# Patient Record
Sex: Female | Born: 1980 | Race: Black or African American | Hispanic: No | Marital: Single | Smoking: Former smoker
Health system: Southern US, Community
[De-identification: ages and names within clinical notes are randomized; demographics above are authoritative.]

## PROBLEM LIST (undated history)

## (undated) ENCOUNTER — Ambulatory Visit (HOSPITAL_COMMUNITY): Admission: EM | Payer: MEDICAID

## (undated) ENCOUNTER — Inpatient Hospital Stay (HOSPITAL_COMMUNITY): Payer: Self-pay

## (undated) DIAGNOSIS — B009 Herpesviral infection, unspecified: Secondary | ICD-10-CM

## (undated) HISTORY — PX: WISDOM TOOTH EXTRACTION: SHX21

## (undated) HISTORY — PX: BREAST BIOPSY: SHX20

---
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
## 2002-08-09 ENCOUNTER — Encounter: Payer: Self-pay | Admitting: Obstetrics and Gynecology

## 2002-08-09 ENCOUNTER — Inpatient Hospital Stay (HOSPITAL_COMMUNITY): Admission: AD | Admit: 2002-08-09 | Discharge: 2002-08-09 | Payer: Self-pay | Admitting: Obstetrics and Gynecology

## 2002-09-28 ENCOUNTER — Inpatient Hospital Stay (HOSPITAL_COMMUNITY): Admission: AD | Admit: 2002-09-28 | Discharge: 2002-09-28 | Payer: Self-pay | Admitting: *Deleted

## 2002-09-30 ENCOUNTER — Encounter: Admission: RE | Admit: 2002-09-30 | Discharge: 2002-09-30 | Payer: Self-pay | Admitting: Obstetrics and Gynecology

## 2002-10-01 ENCOUNTER — Ambulatory Visit (HOSPITAL_COMMUNITY): Admission: RE | Admit: 2002-10-01 | Discharge: 2002-10-01 | Payer: Self-pay

## 2002-10-22 ENCOUNTER — Ambulatory Visit (HOSPITAL_COMMUNITY): Admission: RE | Admit: 2002-10-22 | Discharge: 2002-10-22 | Payer: Self-pay | Admitting: *Deleted

## 2002-11-02 ENCOUNTER — Ambulatory Visit (HOSPITAL_COMMUNITY): Admission: RE | Admit: 2002-11-02 | Discharge: 2002-11-02 | Payer: Self-pay | Admitting: *Deleted

## 2002-11-12 ENCOUNTER — Ambulatory Visit (HOSPITAL_COMMUNITY): Admission: RE | Admit: 2002-11-12 | Discharge: 2002-11-12 | Payer: Self-pay | Admitting: *Deleted

## 2002-11-23 ENCOUNTER — Inpatient Hospital Stay (HOSPITAL_COMMUNITY): Admission: AD | Admit: 2002-11-23 | Discharge: 2002-11-23 | Payer: Self-pay | Admitting: *Deleted

## 2002-11-29 ENCOUNTER — Inpatient Hospital Stay (HOSPITAL_COMMUNITY): Admission: AD | Admit: 2002-11-29 | Discharge: 2002-11-29 | Payer: Self-pay | Admitting: Obstetrics & Gynecology

## 2002-12-10 ENCOUNTER — Inpatient Hospital Stay (HOSPITAL_COMMUNITY): Admission: AD | Admit: 2002-12-10 | Discharge: 2002-12-10 | Payer: Self-pay | Admitting: Family Medicine

## 2002-12-28 ENCOUNTER — Observation Stay (HOSPITAL_COMMUNITY): Admission: AD | Admit: 2002-12-28 | Discharge: 2002-12-28 | Payer: Self-pay | Admitting: *Deleted

## 2003-01-06 ENCOUNTER — Inpatient Hospital Stay (HOSPITAL_COMMUNITY): Admission: AD | Admit: 2003-01-06 | Discharge: 2003-01-06 | Payer: Self-pay | Admitting: Family Medicine

## 2003-01-13 ENCOUNTER — Inpatient Hospital Stay (HOSPITAL_COMMUNITY): Admission: AD | Admit: 2003-01-13 | Discharge: 2003-01-15 | Payer: Self-pay | Admitting: Obstetrics and Gynecology

## 2003-02-13 ENCOUNTER — Emergency Department (HOSPITAL_COMMUNITY): Admission: EM | Admit: 2003-02-13 | Discharge: 2003-02-13 | Payer: Self-pay | Admitting: Emergency Medicine

## 2005-09-19 ENCOUNTER — Emergency Department (HOSPITAL_COMMUNITY): Admission: EM | Admit: 2005-09-19 | Discharge: 2005-09-19 | Payer: Self-pay | Admitting: Emergency Medicine

## 2005-10-18 ENCOUNTER — Emergency Department (HOSPITAL_COMMUNITY): Admission: EM | Admit: 2005-10-18 | Discharge: 2005-10-18 | Payer: Self-pay | Admitting: Emergency Medicine

## 2006-06-30 ENCOUNTER — Emergency Department (HOSPITAL_COMMUNITY): Admission: EM | Admit: 2006-06-30 | Discharge: 2006-06-30 | Payer: Self-pay | Admitting: *Deleted

## 2006-11-01 ENCOUNTER — Emergency Department (HOSPITAL_COMMUNITY): Admission: EM | Admit: 2006-11-01 | Discharge: 2006-11-01 | Payer: Self-pay | Admitting: Emergency Medicine

## 2007-01-25 ENCOUNTER — Emergency Department (HOSPITAL_COMMUNITY): Admission: EM | Admit: 2007-01-25 | Discharge: 2007-01-25 | Payer: Self-pay | Admitting: Emergency Medicine

## 2007-06-14 ENCOUNTER — Ambulatory Visit: Payer: Self-pay | Admitting: Family Medicine

## 2007-06-14 ENCOUNTER — Inpatient Hospital Stay (HOSPITAL_COMMUNITY): Admission: AD | Admit: 2007-06-14 | Discharge: 2007-06-14 | Payer: Self-pay | Admitting: Family Medicine

## 2007-07-11 ENCOUNTER — Emergency Department (HOSPITAL_COMMUNITY): Admission: EM | Admit: 2007-07-11 | Discharge: 2007-07-11 | Payer: Self-pay | Admitting: Emergency Medicine

## 2007-08-29 ENCOUNTER — Emergency Department (HOSPITAL_COMMUNITY): Admission: EM | Admit: 2007-08-29 | Discharge: 2007-08-29 | Payer: Self-pay | Admitting: Emergency Medicine

## 2007-09-08 ENCOUNTER — Emergency Department (HOSPITAL_COMMUNITY): Admission: EM | Admit: 2007-09-08 | Discharge: 2007-09-08 | Payer: Self-pay | Admitting: Emergency Medicine

## 2008-01-05 ENCOUNTER — Emergency Department (HOSPITAL_COMMUNITY): Admission: EM | Admit: 2008-01-05 | Discharge: 2008-01-05 | Payer: Self-pay | Admitting: Emergency Medicine

## 2008-07-05 ENCOUNTER — Inpatient Hospital Stay (HOSPITAL_COMMUNITY): Admission: AD | Admit: 2008-07-05 | Discharge: 2008-07-05 | Payer: Self-pay | Admitting: Obstetrics & Gynecology

## 2008-07-05 ENCOUNTER — Ambulatory Visit: Payer: Self-pay | Admitting: Physician Assistant

## 2009-04-08 ENCOUNTER — Inpatient Hospital Stay (HOSPITAL_COMMUNITY): Admission: AD | Admit: 2009-04-08 | Discharge: 2009-04-08 | Payer: Self-pay | Admitting: Obstetrics & Gynecology

## 2009-04-08 ENCOUNTER — Ambulatory Visit: Payer: Self-pay | Admitting: Obstetrics and Gynecology

## 2009-09-01 ENCOUNTER — Ambulatory Visit: Payer: Self-pay | Admitting: Nurse Practitioner

## 2009-09-01 ENCOUNTER — Inpatient Hospital Stay (HOSPITAL_COMMUNITY): Admission: AD | Admit: 2009-09-01 | Discharge: 2009-09-01 | Payer: Self-pay | Admitting: Family Medicine

## 2010-04-06 LAB — WET PREP, GENITAL: Trich, Wet Prep: NONE SEEN

## 2010-04-06 LAB — POCT PREGNANCY, URINE: Preg Test, Ur: NEGATIVE

## 2010-04-06 LAB — HERPES SIMPLEX VIRUS CULTURE: Culture: DETECTED

## 2010-04-06 LAB — URINALYSIS, ROUTINE W REFLEX MICROSCOPIC
Glucose, UA: NEGATIVE mg/dL
Ketones, ur: NEGATIVE mg/dL
Protein, ur: NEGATIVE mg/dL
pH: 5.5 (ref 5.0–8.0)

## 2010-04-06 LAB — URINE MICROSCOPIC-ADD ON

## 2010-04-15 LAB — URINALYSIS, ROUTINE W REFLEX MICROSCOPIC
Nitrite: NEGATIVE
Specific Gravity, Urine: <1.005 — ABNORMAL LOW (ref 1.005–1.030)
pH: 5.5 (ref 5.0–8.0)

## 2010-04-15 LAB — CBC
Hemoglobin: 14 g/dL (ref 12.0–15.0)
MCHC: 33.1 g/dL (ref 30.0–36.0)
MCV: 89.5 fL (ref 78.0–100.0)
RBC: 4.72 MIL/uL (ref 3.87–5.11)
WBC: 9.1 10*3/uL (ref 4.0–10.5)

## 2010-04-15 LAB — WET PREP, GENITAL: Yeast Wet Prep HPF POC: NONE SEEN

## 2010-04-15 LAB — URINE MICROSCOPIC-ADD ON

## 2010-04-15 LAB — GC/CHLAMYDIA PROBE AMP, GENITAL: Chlamydia, DNA Probe: NEGATIVE

## 2010-04-30 LAB — DIFFERENTIAL
Basophils Relative: 1 % (ref 0–1)
Eosinophils Absolute: 0 10*3/uL (ref 0.0–0.7)
Monocytes Relative: 5 % (ref 3–12)
Neutrophils Relative %: 82 % — ABNORMAL HIGH (ref 43–77)

## 2010-04-30 LAB — CBC
HCT: 42.1 % (ref 36.0–46.0)
MCHC: 34.6 g/dL (ref 30.0–36.0)
Platelets: 198 10*3/uL (ref 150–400)
RBC: 4.76 MIL/uL (ref 3.87–5.11)
RDW: 13.1 % (ref 11.5–15.5)
WBC: 15.8 10*3/uL — ABNORMAL HIGH (ref 4.0–10.5)

## 2010-04-30 LAB — URINALYSIS, ROUTINE W REFLEX MICROSCOPIC
Bilirubin Urine: NEGATIVE
Ketones, ur: NEGATIVE mg/dL
Protein, ur: 30 mg/dL — AB
Specific Gravity, Urine: 1.02 (ref 1.005–1.030)
pH: 7.5 (ref 5.0–8.0)

## 2010-04-30 LAB — POCT PREGNANCY, URINE: Preg Test, Ur: NEGATIVE

## 2010-04-30 LAB — COMPREHENSIVE METABOLIC PANEL
ALT: 23 U/L (ref 0–35)
Alkaline Phosphatase: 60 U/L (ref 39–117)
CO2: 21 mEq/L (ref 19–32)
Chloride: 106 mEq/L (ref 96–112)
Glucose, Bld: 125 mg/dL — ABNORMAL HIGH (ref 70–99)
Potassium: 3.3 mEq/L — ABNORMAL LOW (ref 3.5–5.1)
Sodium: 139 mEq/L (ref 135–145)
Total Protein: 7.7 g/dL (ref 6.0–8.3)

## 2010-04-30 LAB — GAMMA GT: GGT: 18 U/L (ref 7–51)

## 2010-04-30 LAB — RAPID URINE DRUG SCREEN, HOSP PERFORMED
Cocaine: NOT DETECTED
Opiates: NOT DETECTED
Tetrahydrocannabinol: POSITIVE — AB

## 2010-04-30 LAB — URINE MICROSCOPIC-ADD ON

## 2010-07-24 ENCOUNTER — Emergency Department (HOSPITAL_COMMUNITY)
Admission: EM | Admit: 2010-07-24 | Discharge: 2010-07-24 | Discharge status: Against Medical Advice | Payer: Self-pay | Attending: Emergency Medicine | Admitting: Emergency Medicine

## 2010-07-24 DIAGNOSIS — Z0389 Encounter for observation for other suspected diseases and conditions ruled out: Secondary | ICD-10-CM | POA: Insufficient documentation

## 2010-08-02 ENCOUNTER — Emergency Department (HOSPITAL_COMMUNITY)
Admission: EM | Admit: 2010-08-02 | Discharge: 2010-08-02 | Disposition: A | Discharge status: Home / Self Care | Payer: Self-pay | Attending: Emergency Medicine | Admitting: Emergency Medicine

## 2010-08-02 DIAGNOSIS — R112 Nausea with vomiting, unspecified: Secondary | ICD-10-CM | POA: Insufficient documentation

## 2010-08-02 DIAGNOSIS — K5289 Other specified noninfective gastroenteritis and colitis: Secondary | ICD-10-CM | POA: Insufficient documentation

## 2010-08-02 DIAGNOSIS — R197 Diarrhea, unspecified: Secondary | ICD-10-CM | POA: Insufficient documentation

## 2010-08-02 LAB — CBC
HCT: 42.7 % (ref 36.0–46.0)
MCH: 30.1 pg (ref 26.0–34.0)
MCHC: 34.4 g/dL (ref 30.0–36.0)
MCV: 87.3 fL (ref 78.0–100.0)
RDW: 13.7 % (ref 11.5–15.5)

## 2010-08-02 LAB — COMPREHENSIVE METABOLIC PANEL
ALT: 17 U/L (ref 0–35)
AST: 18 U/L (ref 0–37)
Alkaline Phosphatase: 47 U/L (ref 39–117)
CO2: 19 mEq/L (ref 19–32)
GFR calc Af Amer: >60 mL/min (ref >60)
GFR calc non Af Amer: >60 mL/min (ref >60)
Glucose, Bld: 125 mg/dL — ABNORMAL HIGH (ref 70–99)
Potassium: 3.8 mEq/L (ref 3.5–5.1)
Sodium: 139 mEq/L (ref 135–145)

## 2010-08-02 LAB — URINALYSIS, ROUTINE W REFLEX MICROSCOPIC
Bilirubin Urine: NEGATIVE
Glucose, UA: NEGATIVE mg/dL
Hgb urine dipstick: NEGATIVE
Ketones, ur: 15 mg/dL — AB
Protein, ur: 30 mg/dL — AB

## 2010-08-02 LAB — DIFFERENTIAL
Basophils Absolute: 0.1 10*3/uL (ref 0.0–0.1)
Eosinophils Relative: 0 % (ref 0–5)
Lymphocytes Relative: 27 % (ref 12–46)
Lymphs Abs: 2.8 10*3/uL (ref 0.7–4.0)
Monocytes Absolute: 0.7 10*3/uL (ref 0.1–1.0)

## 2010-08-02 LAB — WET PREP, GENITAL: Trich, Wet Prep: NONE SEEN

## 2010-08-02 LAB — OCCULT BLOOD, POC DEVICE: Fecal Occult Bld: POSITIVE

## 2010-08-03 LAB — URINE CULTURE

## 2010-08-03 LAB — GC/CHLAMYDIA PROBE AMP, GENITAL: GC Probe Amp, Genital: NEGATIVE

## 2010-10-11 LAB — URINALYSIS, ROUTINE W REFLEX MICROSCOPIC
Bilirubin Urine: NEGATIVE
Leukocytes, UA: NEGATIVE
Nitrite: NEGATIVE
Specific Gravity, Urine: 1.012
pH: 6

## 2010-10-11 LAB — URINE MICROSCOPIC-ADD ON

## 2010-10-11 LAB — POCT PREGNANCY, URINE: Preg Test, Ur: NEGATIVE

## 2010-10-17 LAB — COMPREHENSIVE METABOLIC PANEL
ALT: 27
AST: 25
Albumin: 3.8
Calcium: 9.6
Creatinine, Ser: 0.9
GFR calc Af Amer: >60
Sodium: 140
Total Protein: 6.9

## 2010-10-17 LAB — CBC
MCHC: 34.3
MCV: 87.5
Platelets: 199
RBC: 4.58
RDW: 13.7

## 2010-10-17 LAB — RAPID URINE DRUG SCREEN, HOSP PERFORMED
Barbiturates: NOT DETECTED
Benzodiazepines: NOT DETECTED
Cocaine: NOT DETECTED

## 2010-10-17 LAB — WET PREP, GENITAL: Trich, Wet Prep: NONE SEEN

## 2010-10-17 LAB — GC/CHLAMYDIA PROBE AMP, GENITAL
Chlamydia, DNA Probe: NEGATIVE
GC Probe Amp, Genital: NEGATIVE

## 2010-10-17 LAB — DIFFERENTIAL
Eosinophils Absolute: 0
Eosinophils Relative: 0
Lymphocytes Relative: 9 — ABNORMAL LOW
Lymphs Abs: 1.4
Monocytes Relative: 3

## 2010-10-17 LAB — URINALYSIS, ROUTINE W REFLEX MICROSCOPIC
Glucose, UA: NEGATIVE
Ketones, ur: NEGATIVE
Protein, ur: 30 — AB
pH: 7.5

## 2010-10-17 LAB — URINE MICROSCOPIC-ADD ON

## 2010-10-17 LAB — SAMPLE TO BLOOD BANK

## 2010-10-18 LAB — CBC
Hemoglobin: 15.2 — ABNORMAL HIGH
MCHC: 35
MCV: 87.6
RBC: 4.96
RDW: 13.6

## 2010-10-18 LAB — COMPREHENSIVE METABOLIC PANEL
ALT: 27
CO2: 20
Calcium: 9.7
Creatinine, Ser: 0.84
Glucose, Bld: 131 — ABNORMAL HIGH
Sodium: 139
Total Bilirubin: 1.7 — ABNORMAL HIGH
Total Protein: 7.4

## 2010-10-18 LAB — DIFFERENTIAL
Basophils Absolute: 0.1
Basophils Relative: 0
Eosinophils Absolute: 0
Eosinophils Relative: 0
Monocytes Absolute: 0.5

## 2010-10-18 LAB — URINALYSIS, ROUTINE W REFLEX MICROSCOPIC
Bilirubin Urine: NEGATIVE
Hgb urine dipstick: NEGATIVE
Protein, ur: NEGATIVE
Urobilinogen, UA: 0.2

## 2010-10-18 LAB — POCT PREGNANCY, URINE: Preg Test, Ur: NEGATIVE

## 2010-10-18 LAB — WET PREP, GENITAL: Yeast Wet Prep HPF POC: NONE SEEN

## 2010-10-19 LAB — DIFFERENTIAL
Basophils Relative: 0
Eosinophils Absolute: 0
Eosinophils Absolute: 0
Eosinophils Relative: 0
Eosinophils Relative: 0
Lymphs Abs: 0.7
Lymphs Abs: 1.5
Monocytes Absolute: 0.3
Monocytes Relative: 3
Neutrophils Relative %: 90 — ABNORMAL HIGH

## 2010-10-19 LAB — COMPREHENSIVE METABOLIC PANEL
AST: 28
BUN: 8
CO2: 22
Calcium: 10.7 — ABNORMAL HIGH
Creatinine, Ser: 1
GFR calc Af Amer: >60
GFR calc non Af Amer: >60
Glucose, Bld: 131 — ABNORMAL HIGH

## 2010-10-19 LAB — URINALYSIS, ROUTINE W REFLEX MICROSCOPIC
Nitrite: NEGATIVE
Specific Gravity, Urine: 1.023
Urobilinogen, UA: 0.2
pH: 8.5 — ABNORMAL HIGH

## 2010-10-19 LAB — CBC
HCT: 38.8
HCT: 48.9 — ABNORMAL HIGH
Hemoglobin: 15.7 — ABNORMAL HIGH
MCHC: 33.4
MCV: 90.2
MCV: 91.8
Platelets: 162
Platelets: 199
WBC: 14.1 — ABNORMAL HIGH

## 2010-10-19 LAB — URINE MICROSCOPIC-ADD ON

## 2010-10-19 LAB — POCT I-STAT, CHEM 8
Chloride: 108
HCT: 51 — ABNORMAL HIGH
Hemoglobin: 17.3 — ABNORMAL HIGH
Potassium: 4.4
Sodium: 141

## 2010-10-19 LAB — LIPASE, BLOOD: Lipase: 27

## 2010-10-26 LAB — POCT I-STAT, CHEM 8
Chloride: 112 mEq/L (ref 96–112)
HCT: 45 % (ref 36.0–46.0)
Hemoglobin: 15.3 g/dL — ABNORMAL HIGH (ref 12.0–15.0)
Potassium: 3.8 mEq/L (ref 3.5–5.1)
Sodium: 144 mEq/L (ref 135–145)

## 2010-10-26 LAB — DIFFERENTIAL
Basophils Absolute: 0.1 K/uL (ref 0.0–0.1)
Basophils Relative: 1 % (ref 0–1)
Eosinophils Absolute: 0 K/uL (ref 0.0–0.7)
Eosinophils Relative: 0 % (ref 0–5)
Lymphocytes Relative: 15 % (ref 12–46)
Lymphs Abs: 2.4 K/uL (ref 0.7–4.0)
Monocytes Absolute: 0.6 K/uL (ref 0.1–1.0)
Monocytes Relative: 4 % (ref 3–12)
Neutro Abs: 12.8 K/uL — ABNORMAL HIGH (ref 1.7–7.7)
Neutrophils Relative %: 81 % — ABNORMAL HIGH (ref 43–77)

## 2010-10-26 LAB — URINALYSIS, ROUTINE W REFLEX MICROSCOPIC
Bilirubin Urine: NEGATIVE
Hgb urine dipstick: NEGATIVE
Ketones, ur: 15 mg/dL — AB
Specific Gravity, Urine: 1.032 — ABNORMAL HIGH (ref 1.005–1.030)
Urobilinogen, UA: 0.2 mg/dL (ref 0.0–1.0)

## 2010-10-26 LAB — CBC
MCHC: 33.5 g/dL (ref 30.0–36.0)
MCV: 88.3 fL (ref 78.0–100.0)
Platelets: 238 10*3/uL (ref 150–400)
RBC: 4.83 MIL/uL (ref 3.87–5.11)
WBC: 16 10*3/uL — ABNORMAL HIGH (ref 4.0–10.5)

## 2010-10-26 LAB — POCT PREGNANCY, URINE: Preg Test, Ur: NEGATIVE

## 2010-10-26 LAB — HEPATIC FUNCTION PANEL
ALT: 28 U/L (ref 0–35)
AST: 32 U/L (ref 0–37)
Albumin: 4.2 g/dL (ref 3.5–5.2)
Alkaline Phosphatase: 52 U/L (ref 39–117)
Bilirubin, Direct: 0.1 mg/dL (ref 0.0–0.3)
Indirect Bilirubin: 0.5 mg/dL (ref 0.3–0.9)
Total Bilirubin: 0.6 mg/dL (ref 0.3–1.2)
Total Protein: 7.1 g/dL (ref 6.0–8.3)

## 2010-10-26 LAB — RAPID URINE DRUG SCREEN, HOSP PERFORMED
Amphetamines: NOT DETECTED
Barbiturates: NOT DETECTED
Benzodiazepines: NOT DETECTED
Cocaine: NOT DETECTED
Opiates: NOT DETECTED
Tetrahydrocannabinol: POSITIVE — AB

## 2010-10-26 LAB — URINE MICROSCOPIC-ADD ON

## 2010-11-01 LAB — URINE MICROSCOPIC-ADD ON

## 2010-11-01 LAB — POCT PREGNANCY, URINE: Operator id: 108131

## 2010-11-01 LAB — URINALYSIS, ROUTINE W REFLEX MICROSCOPIC
Bilirubin Urine: NEGATIVE
Hgb urine dipstick: NEGATIVE
Protein, ur: 30 — AB
Urobilinogen, UA: 0.2

## 2010-11-01 LAB — COMPREHENSIVE METABOLIC PANEL
AST: 45 — ABNORMAL HIGH
Albumin: 4.3
Alkaline Phosphatase: 49
Chloride: 106
GFR calc Af Amer: >60
Potassium: 3.5
Total Bilirubin: 0.9

## 2010-11-01 LAB — DIFFERENTIAL
Basophils Absolute: 0
Basophils Relative: 0
Eosinophils Relative: 0
Monocytes Absolute: 0.4

## 2010-11-01 LAB — WET PREP, GENITAL
Clue Cells Wet Prep HPF POC: NONE SEEN
WBC, Wet Prep HPF POC: NONE SEEN
Yeast Wet Prep HPF POC: NONE SEEN

## 2010-11-01 LAB — CBC
HCT: 40.1
Platelets: 210
WBC: 13.5 — ABNORMAL HIGH

## 2010-11-08 LAB — D-DIMER, QUANTITATIVE: D-Dimer, Quant: <0.22

## 2010-11-22 ENCOUNTER — Emergency Department (HOSPITAL_COMMUNITY)
Admission: EM | Admit: 2010-11-22 | Discharge: 2010-11-22 | Disposition: A | Discharge status: Home / Self Care | Payer: Self-pay | Attending: Emergency Medicine | Admitting: Emergency Medicine

## 2010-11-22 ENCOUNTER — Emergency Department (HOSPITAL_COMMUNITY): Payer: Self-pay

## 2010-11-22 DIAGNOSIS — N83209 Unspecified ovarian cyst, unspecified side: Secondary | ICD-10-CM | POA: Insufficient documentation

## 2010-11-22 DIAGNOSIS — R112 Nausea with vomiting, unspecified: Secondary | ICD-10-CM | POA: Insufficient documentation

## 2010-11-22 DIAGNOSIS — N390 Urinary tract infection, site not specified: Secondary | ICD-10-CM | POA: Insufficient documentation

## 2010-11-22 DIAGNOSIS — R109 Unspecified abdominal pain: Secondary | ICD-10-CM | POA: Insufficient documentation

## 2010-11-22 DIAGNOSIS — R300 Dysuria: Secondary | ICD-10-CM | POA: Insufficient documentation

## 2010-11-22 DIAGNOSIS — R197 Diarrhea, unspecified: Secondary | ICD-10-CM | POA: Insufficient documentation

## 2010-11-22 LAB — COMPREHENSIVE METABOLIC PANEL
Alkaline Phosphatase: 50 U/L (ref 39–117)
BUN: 10 mg/dL (ref 6–23)
CO2: 22 mEq/L (ref 19–32)
GFR calc Af Amer: >90 mL/min (ref >90)
GFR calc non Af Amer: >90 mL/min (ref >90)
Glucose, Bld: 115 mg/dL — ABNORMAL HIGH (ref 70–99)
Potassium: 3.2 mEq/L — ABNORMAL LOW (ref 3.5–5.1)
Total Bilirubin: 0.2 mg/dL — ABNORMAL LOW (ref 0.3–1.2)
Total Protein: 6.9 g/dL (ref 6.0–8.3)

## 2010-11-22 LAB — URINALYSIS, ROUTINE W REFLEX MICROSCOPIC
Bilirubin Urine: NEGATIVE
Glucose, UA: NEGATIVE mg/dL
Hgb urine dipstick: NEGATIVE
Ketones, ur: >80 mg/dL — AB
Protein, ur: NEGATIVE mg/dL
Urobilinogen, UA: 0.2 mg/dL (ref 0.0–1.0)

## 2010-11-22 LAB — WET PREP, GENITAL
Trich, Wet Prep: NONE SEEN
WBC, Wet Prep HPF POC: NONE SEEN
Yeast Wet Prep HPF POC: NONE SEEN

## 2010-11-22 LAB — CBC
HCT: 36.7 % (ref 36.0–46.0)
Hemoglobin: 12.5 g/dL (ref 12.0–15.0)
MCHC: 34.1 g/dL (ref 30.0–36.0)
RBC: 4.12 MIL/uL (ref 3.87–5.11)

## 2010-11-22 LAB — URINE MICROSCOPIC-ADD ON

## 2010-11-22 LAB — LIPASE, BLOOD: Lipase: 21 U/L (ref 11–59)

## 2010-11-22 MED ORDER — IOHEXOL 300 MG/ML  SOLN
100.0000 mL | Freq: Once | INTRAMUSCULAR | Status: AC | PRN
  Administered 2010-11-22: 100 mL via INTRAVENOUS

## 2010-11-23 LAB — GC/CHLAMYDIA PROBE AMP, GENITAL: Chlamydia, DNA Probe: NEGATIVE

## 2011-07-18 ENCOUNTER — Encounter (HOSPITAL_COMMUNITY): Payer: Self-pay | Admitting: *Deleted

## 2011-07-18 ENCOUNTER — Inpatient Hospital Stay (HOSPITAL_COMMUNITY)
Admission: AD | Admit: 2011-07-18 | Discharge: 2011-07-18 | Disposition: A | Discharge status: Home / Self Care | Payer: Medicaid Other | Source: Ambulatory Visit | Attending: Obstetrics & Gynecology | Admitting: Obstetrics & Gynecology

## 2011-07-18 ENCOUNTER — Inpatient Hospital Stay (HOSPITAL_COMMUNITY): Payer: Medicaid Other

## 2011-07-18 DIAGNOSIS — O219 Vomiting of pregnancy, unspecified: Secondary | ICD-10-CM

## 2011-07-18 DIAGNOSIS — R109 Unspecified abdominal pain: Secondary | ICD-10-CM

## 2011-07-18 DIAGNOSIS — O99891 Other specified diseases and conditions complicating pregnancy: Secondary | ICD-10-CM | POA: Insufficient documentation

## 2011-07-18 DIAGNOSIS — O210 Mild hyperemesis gravidarum: Secondary | ICD-10-CM | POA: Insufficient documentation

## 2011-07-18 DIAGNOSIS — O26899 Other specified pregnancy related conditions, unspecified trimester: Secondary | ICD-10-CM

## 2011-07-18 DIAGNOSIS — J069 Acute upper respiratory infection, unspecified: Secondary | ICD-10-CM

## 2011-07-18 DIAGNOSIS — R1032 Left lower quadrant pain: Secondary | ICD-10-CM | POA: Insufficient documentation

## 2011-07-18 HISTORY — DX: Herpesviral infection, unspecified: B00.9

## 2011-07-18 LAB — URINALYSIS, ROUTINE W REFLEX MICROSCOPIC
Bilirubin Urine: NEGATIVE
Ketones, ur: 15 mg/dL — AB
Leukocytes, UA: NEGATIVE
Nitrite: NEGATIVE
Protein, ur: NEGATIVE mg/dL

## 2011-07-18 LAB — CBC WITH DIFFERENTIAL/PLATELET
Basophils Absolute: 0 10*3/uL (ref 0.0–0.1)
Basophils Relative: 0 % (ref 0–1)
Eosinophils Absolute: 0 10*3/uL (ref 0.0–0.7)
HCT: 37.9 % (ref 36.0–46.0)
MCH: 30 pg (ref 26.0–34.0)
MCHC: 33.8 g/dL (ref 30.0–36.0)
Monocytes Absolute: 0.7 10*3/uL (ref 0.1–1.0)
Monocytes Relative: 5 % (ref 3–12)
Neutro Abs: 12.7 10*3/uL — ABNORMAL HIGH (ref 1.7–7.7)
RDW: 13.2 % (ref 11.5–15.5)

## 2011-07-18 LAB — WET PREP, GENITAL
WBC, Wet Prep HPF POC: NONE SEEN
Yeast Wet Prep HPF POC: NONE SEEN

## 2011-07-18 LAB — URINE MICROSCOPIC-ADD ON

## 2011-07-18 MED ORDER — PROMETHAZINE HCL 25 MG PO TABS: 12.5000 mg | ORAL_TABLET | Freq: Four times a day (QID) | ORAL | Status: DC | PRN

## 2011-07-18 MED ORDER — ONDANSETRON HCL 4 MG/2ML IJ SOLN
4.0000 mg | Freq: Once | INTRAMUSCULAR | Status: AC
  Administered 2011-07-18: 4 mg via INTRAVENOUS
  Filled 2011-07-18: qty 2

## 2011-07-18 MED ORDER — ACETAMINOPHEN 325 MG PO TABS
650.0000 mg | ORAL_TABLET | Freq: Once | ORAL | Status: AC
  Administered 2011-07-18: 650 mg via ORAL
  Filled 2011-07-18: qty 2

## 2011-07-18 MED ORDER — LACTATED RINGERS IV BOLUS (SEPSIS)
1000.0000 mL | Freq: Once | INTRAVENOUS | Status: AC
  Administered 2011-07-18: 1000 mL via INTRAVENOUS

## 2011-07-18 NOTE — Discharge Instructions (Signed)
Abdominal Pain During Pregnancy Abdominal discomfort is common in pregnancy. Most of the time, it does not cause harm. There are many causes of abdominal pain. Some causes are more serious than others. Some of the causes of abdominal pain in pregnancy are easily diagnosed. Occasionally, the diagnosis takes time to understand. Other times, the cause is not determined. Abdominal pain can be a sign that something is very wrong with the pregnancy, or the pain may have nothing to do with the pregnancy at all. For this reason, always tell your caregiver if you have any abdominal discomfort. CAUSES Common and harmless causes of abdominal pain include:  Constipation.   Excess gas and bloating.   Round ligament pain. This is pain that is felt in the folds of the groin.   The position the baby or placenta is in.   Baby kicks.   Braxton-Hicks contractions. These are mild contractions that do not cause cervical dilation.  Serious causes of abdominal pain include:  Ectopic pregnancy. This happens when a fertilized egg implants outside of the uterus.   Miscarriage.   Preterm labor. This is when labor starts at less than 37 weeks of pregnancy.   Placental abruption. This is when the placenta partially or completely separates from the uterus.   Preeclampsia. This is often associated with high blood pressure and has been referred to as "toxemia in pregnancy."   Uterine or amniotic fluid infections.  Causes unrelated to pregnancy include:  Urinary tract infection.   Gallbladder stones or inflammation.   Hepatitis or other liver illness.   Intestinal problems, stomach flu, food poisoning, or ulcer.   Appendicitis.   Kidney (renal) stones.   Kidney infection (pylonephritis).  HOME CARE INSTRUCTIONS  For mild pain:  Do not have sexual intercourse or put anything in your vagina until your symptoms go away completely.   Get plenty of rest until your pain improves. If your pain does not  improve in 1 hour, call your caregiver.   Drink clear fluids if you feel nauseous. Avoid solid food as long as you are uncomfortable or nauseous.   Only take medicine as directed by your caregiver.   Keep all follow-up appointments with your caregiver.  SEEK IMMEDIATE MEDICAL CARE IF:  You are bleeding, leaking fluid, or passing tissue from the vagina.   You have increasing pain or cramping.   You have persistent vomiting.   You have painful or bloody urination.   You have a fever.   You notice a decrease in your baby's movements.   You have extreme weakness or feel faint.   You have shortness of breath, with or without abdominal pain.   You develop a severe headache with abdominal pain.   You have abnormal vaginal discharge with abdominal pain.   You have persistent diarrhea.   You have abdominal pain that continues even after rest, or gets worse.  MAKE SURE YOU:   Understand these instructions.   Will watch your condition.   Will get help right away if you are not doing well or get worse.  Document Released: 01/07/2005 Document Revised: 12/27/2010 Document Reviewed: 08/03/2010 [facility] Patient Information 2012 [place], [place].Abdominal Pain During Pregnancy Abdominal discomfort is common in pregnancy. Most of the time, it does not cause harm. There are many causes of abdominal pain. Some causes are more serious than others. Some of the causes of abdominal pain in pregnancy are easily diagnosed. Occasionally, the diagnosis takes time to understand. Other times, the cause is not determined.  Abdominal pain can be a sign that something is very wrong with the pregnancy, or the pain may have nothing to do with the pregnancy at all. For this reason, always tell your caregiver if you have any abdominal discomfort. CAUSES Common and harmless causes of abdominal pain include:  Constipation.   Excess gas and bloating.   Round ligament pain. This is pain that is felt in the  folds of the groin.   The position the baby or placenta is in.   Baby kicks.   Braxton-Hicks contractions. These are mild contractions that do not cause cervical dilation.  Serious causes of abdominal pain include:  Ectopic pregnancy. This happens when a fertilized egg implants outside of the uterus.   Miscarriage.   Preterm labor. This is when labor starts at less than 37 weeks of pregnancy.   Placental abruption. This is when the placenta partially or completely separates from the uterus.   Preeclampsia. This is often associated with high blood pressure and has been referred to as "toxemia in pregnancy."   Uterine or amniotic fluid infections.  Causes unrelated to pregnancy include:  Urinary tract infection.   Gallbladder stones or inflammation.   Hepatitis or other liver illness.   Intestinal problems, stomach flu, food poisoning, or ulcer.   Appendicitis.   Kidney (renal) stones.   Kidney infection (pylonephritis).  HOME CARE INSTRUCTIONS  For mild pain:  Do not have sexual intercourse or put anything in your vagina until your symptoms go away completely.   Get plenty of rest until your pain improves. If your pain does not improve in 1 hour, call your caregiver.   Drink clear fluids if you feel nauseous. Avoid solid food as long as you are uncomfortable or nauseous.   Only take medicine as directed by your caregiver.   Keep all follow-up appointments with your caregiver.  SEEK IMMEDIATE MEDICAL CARE IF:  You are bleeding, leaking fluid, or passing tissue from the vagina.   You have increasing pain or cramping.   You have persistent vomiting.   You have painful or bloody urination.   You have a fever.   You notice a decrease in your baby's movements.   You have extreme weakness or feel faint.   You have shortness of breath, with or without abdominal pain.   You develop a severe headache with abdominal pain.   You have abnormal vaginal discharge  with abdominal pain.   You have persistent diarrhea.   You have abdominal pain that continues even after rest, or gets worse.  MAKE SURE YOU:   Understand these instructions.   Will watch your condition.   Will get help right away if you are not doing well or get worse.  Document Released: 01/07/2005 Document Revised: 12/27/2010 Document Reviewed: 08/03/2010 [facility] Patient Information 2012 [place], [place].   Use Benadryl and Robitussin for your congestion. Start you prenatal vitamins and prenatal care. Return here as needed.    ________________________________________     To schedule your Maternity Eligibility Appointment, please call [phone].  When you arrive for your appointment you must bring the following items or information listed below.  Your appointment will be rescheduled if you do not have these items or are 15 minutes late. If currently receiving Medicaid, you MUST bring: 1. Medicaid Card 2. Social Security Card 3. Picture ID 4. Proof of Pregnancy 5. Verification of current address if the address on Medicaid card is incorrect "postmarked mail" If not receiving Medicaid, you MUST bring: 1.  Social Security Card 2. Picture ID 3. Birth Certificate (if available) Passport or *Green Card 4. Proof of Pregnancy 5. Verification of current address "postmarked mail" for each income presented. 6. Verification of insurance coverage, if any 7. Check stubs from each employer for the previous month (if unable to present check stub  for each week, we will accept check stub for the first and last week ill the same month.) If you can't locate check stubs, you must bring a letter from the employer(s) and it must have the following information on letterhead, typed, in English: o name of company o company telephone number o how long been with the company, if less than one month o how much person earns per hour o how many hours per week work o the gross pay the person earned  for the previous month If you are 31 years old or less, you do not have to bring proof of income unless you work or live with the father of the baby and at that time we will need proof of income from you and/or the father of the baby. Green Card recipients are eligible for Medicaid for Pregnant Women (MPW)

## 2011-07-18 NOTE — MAU Note (Signed)
Patient states she has had a positive home pregnancy test yesterday. Has had vomiting one day last week, then again this morning. Mid abdominal pain since this am. No bleeding.

## 2011-07-18 NOTE — MAU Provider Note (Signed)
History     CSN: [id number]  Arrival date & time 07/18/11  1114   None     Chief Complaint  Patient presents with  . Abdominal Pain  . Emesis    HPI 12:15 pm Leah Cruz is a 31 y.o. female @ [redacted]w[redacted]d gestation who presents to MAU for abdominal pain. Positive HPT yesterday. The pain is located in the LLQ of the abdomen. She rates the pain as 8/10. The pain is constant and is worse with straining when she is vomiting. She feels like the persistent vomiting has caused muscle strain in her lower abdomen. She vomits several times a day. The history was provided by the patient.  Past Medical History  Diagnosis Date  . HSV-2 infection     Past Surgical History  Procedure Date  . Breast biopsy   . Wisdom tooth extraction     History reviewed. No pertinent family history.  History  Substance Use Topics  . Smoking status: Former Games developer  . Smokeless tobacco: Not on file  . Alcohol Use: No    OB History    Grav Para Term Preterm Abortions TAB SAB Ect Mult Living   3 1 1  1 1    1       Review of Systems  Constitutional: Negative for fever, chills, diaphoresis and fatigue.  HENT: Positive for congestion, sneezing and postnasal drip. Negative for ear pain, sore throat, facial swelling, neck pain, neck stiffness, dental problem and sinus pressure.   Eyes: Negative for photophobia, pain and discharge.  Respiratory: Positive for cough. Negative for chest tightness and wheezing.   Gastrointestinal: Positive for nausea, vomiting and abdominal pain. Negative for diarrhea, constipation and abdominal distention.  Genitourinary: Positive for frequency, vaginal discharge and pelvic pain. Negative for dysuria, urgency, flank pain, vaginal bleeding and difficulty urinating.  Musculoskeletal: Positive for back pain. Negative for myalgias and gait problem.  Skin: Negative for color change and rash.  Neurological: Negative for dizziness, speech difficulty, weakness, light-headedness, numbness  and headaches.  Psychiatric/Behavioral: Negative for confusion and agitation. The patient is not nervous/anxious.     Allergies  Review of patient's allergies indicates no known allergies.  Home Medications  No current outpatient prescriptions on file.  BP 116/88  Pulse 87  Temp 99.2 F (37.3 C) (Oral)  Resp 18  Ht 5\' 5"  (1.651 m)  Wt 118 lb 6.4 oz (53.706 kg)  BMI 19.70 kg/m2  SpO2 98%  LMP 05/18/2011  Physical Exam  Nursing note and vitals reviewed. Constitutional: She is oriented to person, place, and time. She appears well-developed and well-nourished. No distress.  HENT:  Head: Normocephalic.  Nose: Rhinorrhea present.  Eyes: EOM are normal.  Neck: Neck supple.  Cardiovascular: Normal rate.   Pulmonary/Chest: Effort normal.  Abdominal: Soft. There is tenderness in the left lower quadrant. There is no rigidity, no rebound, no guarding and no CVA tenderness.  Genitourinary:       External genitalia without lesions. White discharge vaginal vault. Cervix long, closed, no CMT, left adnexal tenderness, uterus approximately 8 - 10 week size.  Musculoskeletal: Normal range of motion.  Neurological: She is alert and oriented to person, place, and time. No cranial nerve deficit.  Skin: Skin is warm and dry.  Psychiatric: She has a normal mood and affect. Her behavior is normal. Judgment and thought content normal.   Results for orders placed during the hospital encounter of 07/18/11 (from the past 24 hour(s))  URINALYSIS, ROUTINE W REFLEX MICROSCOPIC  Status: Abnormal   Collection Time   07/18/11 11:45 AM      Component Value Range   Color, Urine YELLOW  YELLOW   APPearance CLOUDY (*) CLEAR   Specific Gravity, Urine 1.020  1.005 - 1.030   pH 6.0  5.0 - 8.0   Glucose, UA NEGATIVE  NEGATIVE mg/dL   Hgb urine dipstick TRACE (*) NEGATIVE   Bilirubin Urine NEGATIVE  NEGATIVE   Ketones, ur 15 (*) NEGATIVE mg/dL   Protein, ur NEGATIVE  NEGATIVE mg/dL   Urobilinogen, UA 0.2   0.0 - 1.0 mg/dL   Nitrite NEGATIVE  NEGATIVE   Leukocytes, UA NEGATIVE  NEGATIVE  URINE MICROSCOPIC-ADD ON     Status: Abnormal   Collection Time   07/18/11 11:45 AM      Component Value Range   Squamous Epithelial / LPF MANY (*) RARE   RBC / HPF 0-2  <3 RBC/hpf   Bacteria, UA FEW (*) RARE   Urine-Other MUCOUS PRESENT    POCT PREGNANCY, URINE     Status: Abnormal   Collection Time   07/18/11 11:48 AM      Component Value Range   Preg Test, Ur POSITIVE (*) NEGATIVE  CBC WITH DIFFERENTIAL     Status: Abnormal   Collection Time   07/18/11 12:28 PM      Component Value Range   WBC 14.3 (*) 4.0 - 10.5 K/uL   RBC 4.26  3.87 - 5.11 MIL/uL   Hemoglobin 12.8  12.0 - 15.0 g/dL   HCT 16.1  09.6 - 04.5 %   MCV 89.0  78.0 - 100.0 fL   MCH 30.0  26.0 - 34.0 pg   MCHC 33.8  30.0 - 36.0 g/dL   RDW 40.9  81.1 - 91.4 %   Platelets 197  150 - 400 K/uL   Neutrophils Relative 88 (*) 43 - 77 %   Neutro Abs 12.7 (*) 1.7 - 7.7 K/uL   Lymphocytes Relative 6 (*) 12 - 46 %   Lymphs Abs 0.9  0.7 - 4.0 K/uL   Monocytes Relative 5  3 - 12 %   Monocytes Absolute 0.7  0.1 - 1.0 K/uL   Eosinophils Relative 0  0 - 5 %   Eosinophils Absolute 0.0  0.0 - 0.7 K/uL   Basophils Relative 0  0 - 1 %   Basophils Absolute 0.0  0.0 - 0.1 K/uL  HCG, QUANTITATIVE, PREGNANCY     Status: Abnormal   Collection Time   07/18/11 12:28 PM      Component Value Range   hCG, Beta Chain, Quant, [place] [id number] (*) <5 mIU/mL  ABO/RH     Status: Normal (Preliminary result)   Collection Time   07/18/11 12:28 PM      Component Value Range   ABO/RH(D) O POS    WET PREP, GENITAL     Status: Abnormal   Collection Time   07/18/11  1:56 PM      Component Value Range   Yeast Wet Prep HPF POC NONE SEEN  NONE SEEN   Trich, Wet Prep NONE SEEN  NONE SEEN   Clue Cells Wet Prep HPF POC FEW (*) NONE SEEN   WBC, Wet Prep HPF POC NONE SEEN  NONE SEEN   Assessment: 31 y.o. [redacted]w[redacted]d gestation with nausea and vomiting   Abdominal pain in  pregnancy   URI  Plan:  IV hydration   Zofran 4 mg. IV   Labs and ultrasound  Tylenol for pain, Benadryl and Robitussin prn ED Course  Procedures: ultrasound today shows an 8 week 5 day IUP with cardiac activity @ 170 bpm. EDD 02/22/12  MDM  14:00 pm Re evaluation: patient states she is feeling much better. No nausea and abdominal pain is much better.   Plan:  will d/c home with Rx Phenergan, patient to take tylenol as needed for discomfort   I have reviewed this patient's vital signs, nurses notes, appropriate labs and imaging.  I have discussed with the patient in detail the lab and ultrasound findings. Patient voices understanding.

## 2011-07-19 ENCOUNTER — Inpatient Hospital Stay (HOSPITAL_COMMUNITY): Payer: Medicaid Other

## 2011-07-19 ENCOUNTER — Inpatient Hospital Stay (HOSPITAL_COMMUNITY)
Admission: AD | Admit: 2011-07-19 | Discharge: 2011-07-19 | Disposition: A | Discharge status: Home / Self Care | Payer: Medicaid Other | Source: Ambulatory Visit | Attending: Obstetrics & Gynecology | Admitting: Obstetrics & Gynecology

## 2011-07-19 ENCOUNTER — Encounter (HOSPITAL_COMMUNITY): Payer: Self-pay | Admitting: *Deleted

## 2011-07-19 DIAGNOSIS — O210 Mild hyperemesis gravidarum: Secondary | ICD-10-CM | POA: Insufficient documentation

## 2011-07-19 DIAGNOSIS — O209 Hemorrhage in early pregnancy, unspecified: Secondary | ICD-10-CM | POA: Insufficient documentation

## 2011-07-19 DIAGNOSIS — J069 Acute upper respiratory infection, unspecified: Secondary | ICD-10-CM

## 2011-07-19 DIAGNOSIS — R109 Unspecified abdominal pain: Secondary | ICD-10-CM | POA: Insufficient documentation

## 2011-07-19 DIAGNOSIS — O219 Vomiting of pregnancy, unspecified: Secondary | ICD-10-CM

## 2011-07-19 LAB — URINALYSIS, ROUTINE W REFLEX MICROSCOPIC
Glucose, UA: NEGATIVE mg/dL
Ketones, ur: >80 mg/dL — AB
Leukocytes, UA: NEGATIVE
Protein, ur: 30 mg/dL — AB
pH: 6 (ref 5.0–8.0)

## 2011-07-19 LAB — GC/CHLAMYDIA PROBE AMP, GENITAL
Chlamydia, DNA Probe: NEGATIVE
GC Probe Amp, Genital: NEGATIVE

## 2011-07-19 LAB — CBC
HCT: 35.5 % — ABNORMAL LOW (ref 36.0–46.0)
Hemoglobin: 11.9 g/dL — ABNORMAL LOW (ref 12.0–15.0)
MCH: 29.5 pg (ref 26.0–34.0)
MCHC: 33.5 g/dL (ref 30.0–36.0)
MCV: 88.1 fL (ref 78.0–100.0)
Platelets: 193 10*3/uL (ref 150–400)
RBC: 4.03 MIL/uL (ref 3.87–5.11)
RDW: 13.1 % (ref 11.5–15.5)
WBC: 11.4 10*3/uL — ABNORMAL HIGH (ref 4.0–10.5)

## 2011-07-19 LAB — URINE MICROSCOPIC-ADD ON

## 2011-07-19 MED ORDER — ACETAMINOPHEN 325 MG PO TABS
650.0000 mg | ORAL_TABLET | Freq: Once | ORAL | Status: AC
  Administered 2011-07-19: 650 mg via ORAL
  Filled 2011-07-19: qty 2

## 2011-07-19 MED ORDER — METOCLOPRAMIDE HCL 10 MG PO TABS: 10.0000 mg | ORAL_TABLET | Freq: Three times a day (TID) | ORAL | Status: DC

## 2011-07-19 MED ORDER — ONDANSETRON 4 MG PO TBDP
4.0000 mg | ORAL_TABLET | Freq: Once | ORAL | Status: AC
  Administered 2011-07-19: 4 mg via ORAL
  Filled 2011-07-19: qty 1

## 2011-07-19 MED ORDER — LACTATED RINGERS IV SOLN
INTRAVENOUS | Status: DC
  Administered 2011-07-19 (×2): via INTRAVENOUS

## 2011-07-19 MED ORDER — METOCLOPRAMIDE HCL 5 MG/ML IJ SOLN
10.0000 mg | Freq: Once | INTRAMUSCULAR | Status: AC
  Administered 2011-07-19: 10 mg via INTRAVENOUS
  Filled 2011-07-19: qty 2

## 2011-07-19 MED ORDER — DEXTROSE IN LACTATED RINGERS 5 % IV SOLN
INTRAVENOUS | Status: AC
  Administered 2011-07-19: 11:00:00 via INTRAVENOUS

## 2011-07-19 MED ORDER — LACTATED RINGERS IV BOLUS (SEPSIS): 1000.0000 mL | Freq: Once | INTRAVENOUS | Status: DC

## 2011-07-19 NOTE — Progress Notes (Signed)
Pt returns from ultrasound and not rocking in pain or bent over as prior to going to [place].  Reports improvement in pain symptoms.

## 2011-07-19 NOTE — MAU Note (Addendum)
rocking back and forth with abd pain, here yesterday for N&V and given DX of pregnancy, home with Phenergan, vomiting continuously since yesterday. Has loss 3# since yesterday, unable to void  Today Has not eaten since day before yesterday Nicki Reaper, DD

## 2011-07-19 NOTE — Progress Notes (Signed)
Leah Cruz is  31 y.o. G3P1011 at [redacted]w[redacted]d with PMH of HSV2 (on valtrex) who presents with continued intense N&V. Patient was seen in the MAU yesterday for N&V with a new diagnosis of pregnancy. [place] confirmed an IUP with normal ovaries and EGA [redacted]w[redacted]d on 07/18/11. Patient was given IV zofran with good relief and was sent home with prescription for phenergan for n&v. Patient reports that she has been unable to keep the phenergan down in the setting of intense vomiting and has had approximately 8-9 episodes of vomiting over the past 24 hours. She has attempted breads, broth, and soups with no success. Patient also reports that she has been constipated for approximately 10 days now with her usual BM's occuring approximately q2-3 days. Patient also reports chills 2/2 the air conditioner and denies fevers, night sweats, rigors, or vaginal discharge / bleeding.  Obstetrical/Gynecological History: OB History    Grav Para Term Preterm Abortions TAB SAB Ect Mult Living   3 1 1  1 1    1       Past Medical History: Past Medical History  Diagnosis Date  . HSV-2 infection     Past Surgical History: Past Surgical History  Procedure Date  . Breast biopsy   . Wisdom tooth extraction     Family History: History reviewed. No pertinent family history.  Social History: History  Substance Use Topics  . Smoking status: Former Smoker -- 0.5 packs/day for 15 years  . Smokeless tobacco: Not on file  . Alcohol Use: No    Allergies: No Known Allergies  Prescriptions prior to admission  Medication Sig Dispense Refill  . promethazine (PHENERGAN) 25 MG tablet Take 0.5 tablets (12.5 mg total) by mouth every 6 (six) hours as needed for nausea.  20 tablet  0  . valACYclovir (VALTREX) 500 MG tablet Take 1,000 mg by mouth daily.        Review of Systems -  General: positive for  - chills and nausea / vomiting  negative for - fever or night sweats Psychological ROS: negative for - anxiety or depression Ophthalmic  ROS: negative ENT ROS: positive for - green nasal discharge   negative for - headaches, hearing change or visual changes Allergy and Immunology ROS: negative Hematological and Lymphatic ROS: negative for - bleeding problems or night sweats Endocrine ROS: negative for - malaise/lethargy or palpitations Respiratory ROS: positive for cough   Negative for shortness of breath or wheezing Cardiovascular ROS: no chest pain or dyspnea on exertion Gastrointestinal ROS: positive for - abdominal pain, constipation and nausea/vomiting  negative for - diarrhea, hematemesis or melena Genito-Urinary ROS: no dysuria, trouble voiding, or hematuria Musculoskeletal ROS: negative Neurological ROS: no TIA or stroke symptoms Dermatological ROS: negative  Physical Exam   Blood pressure 105/64, pulse 74, temperature 98.1 F (36.7 C), resp. rate 24, height 5\' 5"  (1.651 m), weight 52.39 kg (115 lb 8 oz), last menstrual period 05/18/2011, SpO2 100.00%.  General: General appearance - anxious and in mild to moderate distress Mental status - alert, oriented to person, place, and time Eyes - pupils equal and reactive, extraocular eye movements intact Mouth - mucous membranes moist, pharynx normal without lesions Chest - clear to auscultation, no wheezes, rales or rhonchi, symmetric air entry Heart - normal rate, regular rhythm, normal S1, S2, no murmurs, rubs, clicks or gallops Abdomen - soft, nontender, nondistended, no masses or organomegaly Pelvic - examination not indicated Back exam - full range of motion, no tenderness, palpable spasm or  pain on motion Neurological - alert, oriented, normal speech, no focal findings or movement disorder noted Musculoskeletal - no joint tenderness, deformity or swelling Extremities - peripheral pulses normal, no pedal edema, no clubbing or cyanosis Skin - normal coloration and turgor, no rashes, no suspicious skin lesions noted  Ctx: none   Labs: Recent Results (from the  past 24 hour(s))  URINALYSIS, ROUTINE W REFLEX MICROSCOPIC   Collection Time   07/18/11 11:45 AM      Component Value Range   Color, Urine YELLOW  YELLOW   APPearance CLOUDY (*) CLEAR   Specific Gravity, Urine 1.020  1.005 - 1.030   pH 6.0  5.0 - 8.0   Glucose, UA NEGATIVE  NEGATIVE mg/dL   Hgb urine dipstick TRACE (*) NEGATIVE   Bilirubin Urine NEGATIVE  NEGATIVE   Ketones, ur 15 (*) NEGATIVE mg/dL   Protein, ur NEGATIVE  NEGATIVE mg/dL   Urobilinogen, UA 0.2  0.0 - 1.0 mg/dL   Nitrite NEGATIVE  NEGATIVE   Leukocytes, UA NEGATIVE  NEGATIVE  URINE MICROSCOPIC-ADD ON   Collection Time   07/18/11 11:45 AM      Component Value Range   Squamous Epithelial / LPF MANY (*) RARE   RBC / HPF 0-2  <3 RBC/hpf   Bacteria, UA FEW (*) RARE   Urine-Other MUCOUS PRESENT    POCT PREGNANCY, URINE   Collection Time   07/18/11 11:48 AM      Component Value Range   Preg Test, Ur POSITIVE (*) NEGATIVE  CBC WITH DIFFERENTIAL   Collection Time   07/18/11 12:28 PM      Component Value Range   WBC 14.3 (*) 4.0 - 10.5 K/uL   RBC 4.26  3.87 - 5.11 MIL/uL   Hemoglobin 12.8  12.0 - 15.0 g/dL   HCT 09.8  11.9 - 14.7 %   MCV 89.0  78.0 - 100.0 fL   MCH 30.0  26.0 - 34.0 pg   MCHC 33.8  30.0 - 36.0 g/dL   RDW 82.9  56.2 - 13.0 %   Platelets 197  150 - 400 K/uL   Neutrophils Relative 88 (*) 43 - 77 %   Neutro Abs 12.7 (*) 1.7 - 7.7 K/uL   Lymphocytes Relative 6 (*) 12 - 46 %   Lymphs Abs 0.9  0.7 - 4.0 K/uL   Monocytes Relative 5  3 - 12 %   Monocytes Absolute 0.7  0.1 - 1.0 K/uL   Eosinophils Relative 0  0 - 5 %   Eosinophils Absolute 0.0  0.0 - 0.7 K/uL   Basophils Relative 0  0 - 1 %   Basophils Absolute 0.0  0.0 - 0.1 K/uL  HCG, QUANTITATIVE, PREGNANCY   Collection Time   07/18/11 12:28 PM      Component Value Range   hCG, Beta Chain, Quant, [place] [id number] (*) <5 mIU/mL  ABO/RH   Collection Time   07/18/11 12:28 PM      Component Value Range   ABO/RH(D) O POS    GC/CHLAMYDIA PROBE AMP, GENITAL    Collection Time   07/18/11  1:56 PM      Component Value Range   GC Probe Amp, Genital NEGATIVE  NEGATIVE   Chlamydia, DNA Probe NEGATIVE  NEGATIVE  WET PREP, GENITAL   Collection Time   07/18/11  1:56 PM      Component Value Range   Yeast Wet Prep HPF POC NONE SEEN  NONE SEEN   Trich, Wet  Prep NONE SEEN  NONE SEEN   Clue Cells Wet Prep HPF POC FEW (*) NONE SEEN   WBC, Wet Prep HPF POC NONE SEEN  NONE SEEN  URINALYSIS, ROUTINE W REFLEX MICROSCOPIC   Collection Time   07/19/11 10:36 AM      Component Value Range   Color, Urine YELLOW  YELLOW   APPearance CLEAR  CLEAR   Specific Gravity, Urine 1.025  1.005 - 1.030   pH 6.0  5.0 - 8.0   Glucose, UA NEGATIVE  NEGATIVE mg/dL   Hgb urine dipstick TRACE (*) NEGATIVE   Bilirubin Urine SMALL (*) NEGATIVE   Ketones, ur >80 (*) NEGATIVE mg/dL   Protein, ur 30 (*) NEGATIVE mg/dL   Urobilinogen, UA 0.2  0.0 - 1.0 mg/dL   Nitrite NEGATIVE  NEGATIVE   Leukocytes, UA NEGATIVE  NEGATIVE  URINE MICROSCOPIC-ADD ON   Collection Time   07/19/11 10:36 AM      Component Value Range   Squamous Epithelial / LPF MANY (*) RARE   RBC / HPF 3-6  <3 RBC/hpf   Urine-Other MUCOUS PRESENT     Imaging Studies:  US Ob Comp Less 14 Wks  07/18/2011  OBSTETRICAL ULTRASOUND: This exam was performed within a [facility]. The OB US report was generated in the [facility] system, and faxed to the ordering physician.   This report is also available in [employer] and in the [employer]. See [facility] Obstetric US report.   MAU course: -CBC/UA with micro -Abdominal US IMPRESSION:  Trace of intraluminal sludge within the gallbladder lumen with a  trace of pericholecystic fluid of questionable significance given  the lack of gallbladder wall thickening or associated sonographic  Murphy's sign.  -Zofran 4mg  ODT x 1 with initial relief -Reglan 10mg  IV x 1 for continued vomiting -1L D5LR bolus for ketonuria  Assessment: ETHYLENE REZNICK  is  31 y.o. G3P1011 at [redacted]w[redacted]d presents with emesis of pregnancy. 1) N&V of pregnancy 2) Abdominal pain 2/2 muscle strain from vomiting 3) Ketonuria 4) URI  Plan: Will give rx for reglan prn for nausea / vomiting; also has home phenergan as needed for nausea / vomiting Continue tylenol as needed for pain, do not exceed 4g Tylenol in 24 hour period Begin colace daily prn for constipation  Girguis, David6/28/201311:07 AM  [facility]

## 2011-09-19 LAB — OB RESULTS CONSOLE HIV ANTIBODY (ROUTINE TESTING): HIV: NONREACTIVE

## 2012-01-22 NOTE — L&D Delivery Note (Signed)
Delivery Note At 3:33 PM a viable female was delivered via  (Presentation:  ROA ;  ).  APGAR: 8 - 9, ; weight .   Placenta status: Intact, Spontaneous.  Cord:  with the following complications: None .  Cord pH: none  Anesthesia: Epidural  Episiotomy: None Lacerations: None Suture Repair: none Est. Blood Loss (mL):   Mom to postpartum.  Baby to nursery-stable.  HARPER,CHARLES A 02/28/2012, 3:46 PM

## 2012-02-25 ENCOUNTER — Inpatient Hospital Stay (HOSPITAL_COMMUNITY)
Admission: AD | Admit: 2012-02-25 | Discharge: 2012-02-25 | Disposition: A | Discharge status: Home / Self Care | Payer: Medicaid Other | Source: Ambulatory Visit | Attending: Obstetrics | Admitting: Obstetrics

## 2012-02-25 DIAGNOSIS — O479 False labor, unspecified: Secondary | ICD-10-CM | POA: Insufficient documentation

## 2012-02-28 ENCOUNTER — Encounter (HOSPITAL_COMMUNITY): Payer: Self-pay | Admitting: *Deleted

## 2012-02-28 ENCOUNTER — Encounter (HOSPITAL_COMMUNITY): Payer: Self-pay | Admitting: Anesthesiology

## 2012-02-28 ENCOUNTER — Inpatient Hospital Stay (HOSPITAL_COMMUNITY): Payer: Medicaid Other | Admitting: Anesthesiology

## 2012-02-28 ENCOUNTER — Inpatient Hospital Stay (HOSPITAL_COMMUNITY)
Admission: AD | Admit: 2012-02-28 | Discharge: 2012-03-01 | DRG: 775 | Disposition: A | Discharge status: Home / Self Care | Payer: Medicaid Other | Source: Ambulatory Visit | Attending: Obstetrics & Gynecology | Admitting: Obstetrics & Gynecology

## 2012-02-28 LAB — CBC
HCT: 42.7 % (ref 36.0–46.0)
MCHC: 32.8 g/dL (ref 30.0–36.0)
MCV: 91.2 fL (ref 78.0–100.0)
RDW: 15.1 % (ref 11.5–15.5)

## 2012-02-28 LAB — POCT FERN TEST: POCT Fern Test: NEGATIVE

## 2012-02-28 LAB — AMNISURE RUPTURE OF MEMBRANE (ROM) NOT AT ARMC: Amnisure ROM: NEGATIVE

## 2012-02-28 MED ORDER — OXYCODONE-ACETAMINOPHEN 5-325 MG PO TABS
1.0000 | ORAL_TABLET | ORAL | Status: DC | PRN
  Administered 2012-02-28 – 2012-02-29 (×2): 2 via ORAL
  Administered 2012-02-29 (×2): 1 via ORAL
  Filled 2012-02-28: qty 1
  Filled 2012-02-28: qty 2
  Filled 2012-02-28: qty 1
  Filled 2012-02-28: qty 2

## 2012-02-28 MED ORDER — LACTATED RINGERS IV SOLN: 500.0000 mL | INTRAVENOUS | Status: DC | PRN

## 2012-02-28 MED ORDER — MEDROXYPROGESTERONE ACETATE 150 MG/ML IM SUSP: 150.0000 mg | INTRAMUSCULAR | Status: DC | PRN

## 2012-02-28 MED ORDER — PHENYLEPHRINE 40 MCG/ML (10ML) SYRINGE FOR IV PUSH (FOR BLOOD PRESSURE SUPPORT)
80.0000 ug | PREFILLED_SYRINGE | INTRAVENOUS | Status: DC | PRN
  Filled 2012-02-28: qty 5

## 2012-02-28 MED ORDER — ACETAMINOPHEN 325 MG PO TABS: 650.0000 mg | ORAL_TABLET | ORAL | Status: DC | PRN

## 2012-02-28 MED ORDER — ONDANSETRON HCL 4 MG/2ML IJ SOLN: 4.0000 mg | INTRAMUSCULAR | Status: DC | PRN

## 2012-02-28 MED ORDER — METHYLERGONOVINE MALEATE 0.2 MG/ML IJ SOLN
INTRAMUSCULAR | Status: AC
  Administered 2012-02-28: 0.2 mg via INTRAMUSCULAR
  Filled 2012-02-28: qty 1

## 2012-02-28 MED ORDER — PHENYLEPHRINE 40 MCG/ML (10ML) SYRINGE FOR IV PUSH (FOR BLOOD PRESSURE SUPPORT): 80.0000 ug | PREFILLED_SYRINGE | INTRAVENOUS | Status: DC | PRN

## 2012-02-28 MED ORDER — PRENATAL MULTIVITAMIN CH
1.0000 | ORAL_TABLET | Freq: Every day | ORAL | Status: DC
  Administered 2012-02-29 – 2012-03-01 (×2): 1 via ORAL
  Filled 2012-02-28 (×2): qty 1

## 2012-02-28 MED ORDER — LANOLIN HYDROUS EX OINT: TOPICAL_OINTMENT | CUTANEOUS | Status: DC | PRN

## 2012-02-28 MED ORDER — IBUPROFEN 600 MG PO TABS
600.0000 mg | ORAL_TABLET | Freq: Four times a day (QID) | ORAL | Status: DC
  Administered 2012-02-28 – 2012-03-01 (×7): 600 mg via ORAL
  Filled 2012-02-28 (×6): qty 1

## 2012-02-28 MED ORDER — DIBUCAINE 1 % RE OINT: 1.0000 "application " | TOPICAL_OINTMENT | RECTAL | Status: DC | PRN

## 2012-02-28 MED ORDER — CITRIC ACID-SODIUM CITRATE 334-500 MG/5ML PO SOLN: 30.0000 mL | ORAL | Status: DC | PRN

## 2012-02-28 MED ORDER — OXYTOCIN 40 UNITS IN LACTATED RINGERS INFUSION - SIMPLE MED
62.5000 mL/h | INTRAVENOUS | Status: DC
  Filled 2012-02-28: qty 1000

## 2012-02-28 MED ORDER — DIPHENHYDRAMINE HCL 25 MG PO CAPS: 25.0000 mg | ORAL_CAPSULE | Freq: Four times a day (QID) | ORAL | Status: DC | PRN

## 2012-02-28 MED ORDER — FENTANYL 2.5 MCG/ML BUPIVACAINE 1/10 % EPIDURAL INFUSION (WH - ANES)
14.0000 mL/h | INTRAMUSCULAR | Status: DC
  Administered 2012-02-28 (×2): 14 mL/h via EPIDURAL
  Filled 2012-02-28 (×2): qty 125

## 2012-02-28 MED ORDER — ONDANSETRON HCL 4 MG/2ML IJ SOLN
4.0000 mg | Freq: Four times a day (QID) | INTRAMUSCULAR | Status: DC | PRN
  Administered 2012-02-28: 4 mg via INTRAVENOUS
  Filled 2012-02-28: qty 2

## 2012-02-28 MED ORDER — OXYTOCIN BOLUS FROM INFUSION: 500.0000 mL | INTRAVENOUS | Status: DC

## 2012-02-28 MED ORDER — SIMETHICONE 80 MG PO CHEW: 80.0000 mg | CHEWABLE_TABLET | ORAL | Status: DC | PRN

## 2012-02-28 MED ORDER — ONDANSETRON HCL 4 MG PO TABS: 4.0000 mg | ORAL_TABLET | ORAL | Status: DC | PRN

## 2012-02-28 MED ORDER — SENNOSIDES-DOCUSATE SODIUM 8.6-50 MG PO TABS
2.0000 | ORAL_TABLET | Freq: Every day | ORAL | Status: DC
  Administered 2012-02-28 – 2012-02-29 (×2): 2 via ORAL

## 2012-02-28 MED ORDER — ZOLPIDEM TARTRATE 5 MG PO TABS: 5.0000 mg | ORAL_TABLET | Freq: Every evening | ORAL | Status: DC | PRN

## 2012-02-28 MED ORDER — LIDOCAINE HCL (PF) 1 % IJ SOLN
INTRAMUSCULAR | Status: DC | PRN
  Administered 2012-02-28 (×2): 5 mL

## 2012-02-28 MED ORDER — DIPHENHYDRAMINE HCL 50 MG/ML IJ SOLN
12.5000 mg | INTRAMUSCULAR | Status: DC | PRN
  Administered 2012-02-28 (×2): 12.5 mg via INTRAVENOUS
  Filled 2012-02-28: qty 1

## 2012-02-28 MED ORDER — EPHEDRINE 5 MG/ML INJ
10.0000 mg | INTRAVENOUS | Status: DC | PRN
  Filled 2012-02-28: qty 4

## 2012-02-28 MED ORDER — LIDOCAINE HCL (PF) 1 % IJ SOLN
30.0000 mL | INTRAMUSCULAR | Status: DC | PRN
  Filled 2012-02-28: qty 30

## 2012-02-28 MED ORDER — OXYCODONE-ACETAMINOPHEN 5-325 MG PO TABS: 1.0000 | ORAL_TABLET | ORAL | Status: DC | PRN

## 2012-02-28 MED ORDER — LACTATED RINGERS IV SOLN
500.0000 mL | Freq: Once | INTRAVENOUS | Status: AC
  Administered 2012-02-28: 1000 mL via INTRAVENOUS

## 2012-02-28 MED ORDER — EPHEDRINE 5 MG/ML INJ: 10.0000 mg | INTRAVENOUS | Status: DC | PRN

## 2012-02-28 MED ORDER — TETANUS-DIPHTH-ACELL PERTUSSIS 5-2.5-18.5 LF-MCG/0.5 IM SUSP: 0.5000 mL | Freq: Once | INTRAMUSCULAR | Status: DC

## 2012-02-28 MED ORDER — IBUPROFEN 600 MG PO TABS: 600.0000 mg | ORAL_TABLET | Freq: Four times a day (QID) | ORAL | Status: DC | PRN

## 2012-02-28 MED ORDER — LACTATED RINGERS IV SOLN
INTRAVENOUS | Status: DC
  Administered 2012-02-28: 06:00:00 via INTRAVENOUS

## 2012-02-28 MED ORDER — LACTATED RINGERS IV SOLN
INTRAVENOUS | Status: DC
  Administered 2012-02-28: 11:00:00 via INTRAUTERINE

## 2012-02-28 MED ORDER — FLEET ENEMA 7-19 GM/118ML RE ENEM: 1.0000 | ENEMA | RECTAL | Status: DC | PRN

## 2012-02-28 MED ORDER — OXYTOCIN 40 UNITS IN LACTATED RINGERS INFUSION - SIMPLE MED: 62.5000 mL/h | INTRAVENOUS | Status: DC | PRN

## 2012-02-28 MED ORDER — BENZOCAINE-MENTHOL 20-0.5 % EX AERO: 1.0000 "application " | INHALATION_SPRAY | CUTANEOUS | Status: DC | PRN

## 2012-02-28 MED ORDER — MISOPROSTOL 50MCG HALF TABLET
75.0000 ug | ORAL_TABLET | ORAL | Status: DC
  Administered 2012-02-28: 75 ug via ORAL
  Filled 2012-02-28: qty 0.75

## 2012-02-28 MED ORDER — WITCH HAZEL-GLYCERIN EX PADS: 1.0000 "application " | MEDICATED_PAD | CUTANEOUS | Status: DC | PRN

## 2012-02-28 NOTE — Anesthesia Preprocedure Evaluation (Signed)

## 2012-02-28 NOTE — Progress Notes (Signed)
24 dr Gaynell Face called to check on pt.  Dr Tamela Oddi and harper in OR and to call Dr Gaynell Face if needed for delivery.

## 2012-02-28 NOTE — MAU Note (Signed)
PT SAYS SHE WOKE AT 0115- HAD BLOOD AND GREEN MUCUS  IN PANTIES.     4 CM IN OFFICE  TODAY.    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  TAKING VALTREX NOW..    DENIES MRSA.

## 2012-02-28 NOTE — Anesthesia Procedure Notes (Signed)
Epidural Patient location during procedure: OB Start time: 02/28/2012 4:20 AM  Staffing Anesthesiologist: Angus Seller., Harrell Gave. Performed by: anesthesiologist   Preanesthetic Checklist Completed: patient identified, site marked, surgical consent, pre-op evaluation, timeout performed, IV checked, risks and benefits discussed and monitors and equipment checked  Epidural Patient position: sitting Prep: site prepped and draped and DuraPrep Patient monitoring: continuous pulse ox and blood pressure Approach: midline Injection technique: LOR air and LOR saline  Needle:  Needle type: Tuohy  Needle gauge: 17 G Needle length: 9 cm and 9 Needle insertion depth: 5 cm cm Catheter type: closed end flexible Catheter size: 19 Gauge Catheter at skin depth: 10 cm Test dose: negative  Assessment Events: blood not aspirated, injection not painful, no injection resistance, negative IV test and no paresthesia  Additional Notes Patient identified.  Risk benefits discussed including failed block, incomplete pain control, headache, nerve damage, paralysis, blood pressure changes, nausea, vomiting, reactions to medication both toxic or allergic, and postpartum back pain.  Patient expressed understanding and wished to proceed.  All questions were answered.  Sterile technique used throughout procedure and epidural site dressed with sterile barrier dressing. No paresthesia or other complications noted.The patient did not experience any signs of intravascular injection such as tinnitus or metallic taste in mouth nor signs of intrathecal spread such as rapid motor block. Please see nursing notes for vital signs.

## 2012-02-28 NOTE — H&P (Signed)
This is Dr. Francoise Ceo dictating the history and physical on Leah Cruz she's a 32 year old gravida 3 para 1011 at 40 weeks and 6 days EDC 2 on 14 negative GBS she was admitted in labor 5 cm dilated vertex -2 she's now 8 cm 100% vertex -2-3 amniotomy performed the fluid clear she has an epidural Past medical history negative Past surgical history negative Social history negative System review negative Physical exam revealed a well-developed female in labor HEENT negative Lungs clear to P&A Breasts negative Heart regular rhythm no murmurs no gallops Abdomen term estimated fetal weight 7 lbs. 5 oz. Pelvic as described above Extremities negative

## 2012-02-28 NOTE — Progress Notes (Signed)
Leah Cruz is a 32 y.o. G3P1011 at [redacted]w[redacted]d by LMP admitted for active labor  Subjective:   Objective: BP 105/69  Pulse 106  Temp 99.2 F (37.3 C) (Oral)  Resp 16  Ht 5\' 6"  (1.676 m)  Wt 160 lb 2 oz (72.632 kg)  BMI 25.84 kg/m2  SpO2 100%  LMP 05/18/2011 I/O last 3 completed shifts: In: -  Out: 300 [Urine:300] Total I/O In: -  Out: 250 [Urine:250]  FHT:  FHR: 150 bpm, variability: moderate,  accelerations:  Present,  decelerations:  Absent UC:   regular, every 2 minutes SVE:   Dilation: 10 Effacement (%): 100 Station: +2 Exam by:: L. lima ,RN  Labs: Lab Results  Component Value Date   WBC 13.1* 02/28/2012   HGB 14.0 02/28/2012   HCT 42.7 02/28/2012   MCV 91.2 02/28/2012   PLT 204 02/28/2012    Assessment / Plan: Spontaneous labor, progressing normally  Labor: Progressing normally Preeclampsia:  n/a Fetal Wellbeing:  Category I Pain Control:  Epidural I/D:  n/a Anticipated MOD:  NSVD  Henchy Mccauley A 02/28/2012, 3:44 PM

## 2012-02-28 NOTE — Progress Notes (Signed)
Leah Cruz is a 32 y.o. G3P1011 at [redacted]w[redacted]d by LMP admitted for active labor  Subjective: Comfortable   Objective: BP 103/63  Pulse 85  Temp 98.3 F (36.8 C) (Oral)  Resp 16  Ht 5\' 6"  (1.676 m)  Wt 72.632 kg (160 lb 2 oz)  BMI 25.84 kg/m2  SpO2 100%  LMP 05/18/2011 I/O last 3 completed shifts: In: -  Out: 300 [Urine:300]    FHT:  FHR: 140 bpm, variability: moderate,  accelerations:  Present,  decelerations:  Absent UC:   irregular, every 5 minutes SVE:   Dilation: 6 Effacement (%): 80 Station: -2 Exam by:: jackson-moore (IUPC placed)  Labs: Lab Results  Component Value Date   WBC 13.1* 02/28/2012   HGB 14.0 02/28/2012   HCT 42.7 02/28/2012   MCV 91.2 02/28/2012   PLT 204 02/28/2012    Assessment / Plan: Protracted latent phase  Labor: will augment labor w/oral cytotec Preeclampsia:  n/a Fetal Wellbeing:  Category I Pain Control:  Epidural I/D:  n/a Anticipated MOD:  NSVD  JACKSON-MOORE,Saidah Kempton A 02/28/2012, 10:39 AM

## 2012-02-29 ENCOUNTER — Inpatient Hospital Stay (HOSPITAL_COMMUNITY)
Admission: RE | Admit: 2012-02-29 | Discharge: 2012-02-29 | Disposition: A | Discharge status: Home / Self Care | Payer: Medicaid Other | Source: Ambulatory Visit | Attending: Obstetrics & Gynecology | Admitting: Obstetrics & Gynecology

## 2012-02-29 LAB — CBC
HCT: 35.8 % — ABNORMAL LOW (ref 36.0–46.0)
MCH: 29.3 pg (ref 26.0–34.0)
MCV: 91.3 fL (ref 78.0–100.0)
Platelets: 181 10*3/uL (ref 150–400)
RBC: 3.92 MIL/uL (ref 3.87–5.11)

## 2012-02-29 NOTE — Progress Notes (Signed)
Patient ID: FARRAN AMSDEN, female   DOB: [birth year], 32 y.o.   MRN: [id number] Post Partum Day 1 S/P spontaneous vaginal RH status/Rubella reviewed.  Feeding: bottle Subjective: No HA, SOB, CP, F/C, breast symptoms. C/O heavier vaginal bleeding, no clots.     Objective: BP 103/70  Pulse 68  Temp(Src) 97.7 F (36.5 C) (Oral)  Resp 18  Ht 5\' 6"  (1.676 m)  Wt 72.632 kg (160 lb 2 oz)  BMI 25.86 kg/m2  SpO2 100%  LMP 05/18/2011   Physical Exam:  General: alert Lochia: appropriate Uterine Fundus: firm DVT Evaluation: No evidence of DVT seen on physical exam. Ext: No c/c/e  Recent Labs  02/28/12 0400 02/29/12 0548  HGB 14.0 11.7*  HCT 42.7 35.8*      Assessment/Plan: 32 y.o.  PPD #1 .  normal postpartum exam Continue current postpartum care Ambulate   LOS: 1 day   JACKSON-MOORE,Eleonor Ocon A 02/29/2012, 10:29 AM

## 2012-02-29 NOTE — Anesthesia Postprocedure Evaluation (Signed)
Anesthesia Post Note  Patient: Leah Cruz  Procedure(s) Performed: * No procedures listed *  Anesthesia type: Epidural  Patient location: Mother/Baby  Post pain: Pain level controlled  Post assessment: Post-op Vital signs reviewed  Last Vitals:  Filed Vitals:   02/29/12 0635  BP: 103/70  Pulse: 68  Temp: 36.5 C  Resp: 18    Post vital signs: Reviewed  Level of consciousness:alert  Complications: No apparent anesthesia complications

## 2012-03-01 MED ORDER — VALACYCLOVIR HCL 500 MG PO TABS: 500.0000 mg | ORAL_TABLET | Freq: Every day | ORAL | Status: DC

## 2012-03-01 MED ORDER — MEDROXYPROGESTERONE ACETATE 150 MG/ML IM SUSP: 150.0000 mg | INTRAMUSCULAR | Status: DC

## 2012-03-01 MED ORDER — OXYCODONE-ACETAMINOPHEN 5-325 MG PO TABS: 1.0000 | ORAL_TABLET | Freq: Four times a day (QID) | ORAL | Status: DC | PRN

## 2012-03-01 NOTE — Discharge Summary (Signed)
  Obstetric Discharge Summary Reason for Admission: onset of labor Prenatal Procedures: none Intrapartum Procedures: spontaneous vaginal delivery Postpartum Procedures: none Complications-Operative and Postpartum: none  Hemoglobin  Date Value Range Status  02/29/2012 11.7* 12.0 - 15.0 g/dL Final     REPEATED TO VERIFY     HCT  Date Value Range Status  02/29/2012 35.8* 36.0 - 46.0 % Final    Physical Exam:  General: alert Lochia: appropriate Uterine: firm Incision: n/a DVT Evaluation: No evidence of DVT seen on physical exam.  Discharge Diagnoses: Active Problems:   Normal delivery   Discharge Information: Date: 03/01/2012 Activity: pelvic rest Diet: routine Medications:  Prior to Admission medications   Medication Sig Start Date End Date Taking? Authorizing Provider  Prenatal Vit-Fe Fumarate-FA (PRENATAL MULTIVITAMIN) TABS Take 1 tablet by mouth daily.   Yes Historical Provider, MD  medroxyPROGESTERone (DEPO-PROVERA) 150 MG/ML injection Inject 1 mL (150 mg total) into the muscle every 3 (three) months. 03/01/12   Antionette Char, MD  oxyCODONE-acetaminophen (PERCOCET/ROXICET) 5-325 MG per tablet Take 1-2 tablets by mouth every 6 (six) hours as needed for pain. 03/01/12   Antionette Char, MD  valACYclovir (VALTREX) 500 MG tablet Take 1 tablet (500 mg total) by mouth daily. 03/01/12   Antionette Char, MD    Condition: stable Instructions: refer to routine discharge instructions Discharge to: home Follow-up Information   Follow up with Antionette Char A, MD. Schedule an appointment as soon as possible for a visit in 6 weeks. (Call to schedule Depo provera/circumcision)    Contact information:   [address] [place] [address] [phone]       Newborn Data: Live born  Information for the patient's newborn:  Vivan, Vanderveer [[id number]]  female ; APGAR (1 MIN): 8   APGAR (5 MINS): 9     Home with mother.  JACKSON-MOORE,Laverne Klugh A 03/01/2012,  10:15 AM

## 2012-03-02 NOTE — Progress Notes (Signed)
Post discharge chart review completed.  

## 2012-03-03 ENCOUNTER — Other Ambulatory Visit (HOSPITAL_COMMUNITY): Payer: Self-pay | Admitting: Obstetrics

## 2012-05-25 ENCOUNTER — Ambulatory Visit (INDEPENDENT_AMBULATORY_CARE_PROVIDER_SITE_OTHER): Payer: Medicaid Other | Admitting: *Deleted

## 2012-05-25 VITALS — BP 121/81 | HR 93 | Temp 98.0°F | Wt 135.0 lb

## 2012-05-25 DIAGNOSIS — Z111 Encounter for screening for respiratory tuberculosis: Secondary | ICD-10-CM

## 2012-05-25 DIAGNOSIS — Z3049 Encounter for surveillance of other contraceptives: Secondary | ICD-10-CM

## 2012-05-25 DIAGNOSIS — IMO0001 Reserved for inherently not codable concepts without codable children: Secondary | ICD-10-CM

## 2012-05-25 MED ORDER — MEDROXYPROGESTERONE ACETATE 150 MG/ML IM SUSP
150.0000 mg | Freq: Once | INTRAMUSCULAR | Status: AC
  Administered 2012-05-25: 150 mg via INTRAMUSCULAR

## 2012-05-25 NOTE — Progress Notes (Addendum)
Pt in office today for a Depo injection. Pt had her last injection May 25, 2012.  Pt due for next injection on 08-16-12

## 2012-08-11 ENCOUNTER — Ambulatory Visit: Payer: Medicaid Other

## 2012-08-24 ENCOUNTER — Ambulatory Visit (INDEPENDENT_AMBULATORY_CARE_PROVIDER_SITE_OTHER): Payer: Medicaid Other | Admitting: *Deleted

## 2012-08-24 VITALS — BP 141/86 | HR 75 | Wt 132.0 lb

## 2012-08-24 DIAGNOSIS — Z309 Encounter for contraceptive management, unspecified: Secondary | ICD-10-CM

## 2012-08-24 MED ORDER — MEDROXYPROGESTERONE ACETATE 150 MG/ML IM SUSP
150.0000 mg | INTRAMUSCULAR | Status: AC
  Administered 2012-08-24 – 2013-02-09 (×3): 150 mg via INTRAMUSCULAR

## 2012-08-24 MED ORDER — MEDROXYPROGESTERONE ACETATE 150 MG/ML IM SUSP: 150.0000 mg | INTRAMUSCULAR | Status: DC

## 2012-08-27 ENCOUNTER — Ambulatory Visit: Payer: Medicaid Other

## 2012-11-16 ENCOUNTER — Ambulatory Visit (INDEPENDENT_AMBULATORY_CARE_PROVIDER_SITE_OTHER): Payer: Medicaid Other | Admitting: *Deleted

## 2012-11-16 VITALS — BP 107/77 | HR 72 | Temp 98.2°F | Ht 66.0 in | Wt 128.0 lb

## 2012-11-16 DIAGNOSIS — IMO0001 Reserved for inherently not codable concepts without codable children: Secondary | ICD-10-CM

## 2012-11-16 DIAGNOSIS — Z309 Encounter for contraceptive management, unspecified: Secondary | ICD-10-CM

## 2012-11-16 NOTE — Progress Notes (Signed)
Patient is here today for her Depo injection. Patient tolerated well and is to RTO around 02/07/13.

## 2012-11-26 ENCOUNTER — Other Ambulatory Visit: Payer: Self-pay

## 2013-02-08 ENCOUNTER — Ambulatory Visit: Payer: Medicaid Other

## 2013-02-09 ENCOUNTER — Ambulatory Visit (INDEPENDENT_AMBULATORY_CARE_PROVIDER_SITE_OTHER): Payer: Medicaid Other | Admitting: *Deleted

## 2013-02-09 VITALS — BP 120/73 | HR 71 | Temp 97.8°F | Ht 66.0 in | Wt 122.0 lb

## 2013-02-09 DIAGNOSIS — Z309 Encounter for contraceptive management, unspecified: Secondary | ICD-10-CM

## 2013-02-09 DIAGNOSIS — IMO0001 Reserved for inherently not codable concepts without codable children: Secondary | ICD-10-CM

## 2013-02-09 NOTE — Progress Notes (Signed)
Pt in office today for depot injection

## 2013-05-03 ENCOUNTER — Ambulatory Visit: Payer: Medicaid Other

## 2013-05-04 ENCOUNTER — Ambulatory Visit (INDEPENDENT_AMBULATORY_CARE_PROVIDER_SITE_OTHER): Payer: Medicaid Other | Admitting: *Deleted

## 2013-05-04 VITALS — BP 125/80 | HR 64 | Temp 98.6°F | Ht 66.0 in | Wt 126.0 lb

## 2013-05-04 DIAGNOSIS — IMO0001 Reserved for inherently not codable concepts without codable children: Secondary | ICD-10-CM

## 2013-05-04 DIAGNOSIS — Z309 Encounter for contraceptive management, unspecified: Secondary | ICD-10-CM

## 2013-05-04 MED ORDER — MEDROXYPROGESTERONE ACETATE 150 MG/ML IM SUSP
150.0000 mg | INTRAMUSCULAR | Status: AC
  Administered 2013-05-04 – 2014-01-03 (×4): 150 mg via INTRAMUSCULAR

## 2013-05-04 NOTE — Progress Notes (Signed)
Patient is in the office today for her DEPO Injection. Patient is on time for her Injection. Injection given in Right Upper Outer Quadrant. Patient tolerated well. Patient notified to make appointment for July 26, 2013 for next DEPO Injection. Patient states she will bleed the whole 3 months until the week before her Injection is due and then the bleeding will stop. Patient states bleeding will start back right after getting her injection.

## 2013-07-20 ENCOUNTER — Ambulatory Visit (INDEPENDENT_AMBULATORY_CARE_PROVIDER_SITE_OTHER): Payer: Medicaid Other | Admitting: *Deleted

## 2013-07-20 VITALS — BP 117/74 | HR 85 | Temp 98.6°F | Wt 124.0 lb

## 2013-07-20 DIAGNOSIS — Z3049 Encounter for surveillance of other contraceptives: Secondary | ICD-10-CM

## 2013-07-20 DIAGNOSIS — Z309 Encounter for contraceptive management, unspecified: Secondary | ICD-10-CM

## 2013-07-20 DIAGNOSIS — Z3042 Encounter for surveillance of injectable contraceptive: Secondary | ICD-10-CM

## 2013-07-20 NOTE — Progress Notes (Signed)
Pt is in office for Depo injection.  Pt is on time for her injection.  Injection given in left deltoid.  Pt tolerated well.  Pt states that she doesn't really have a cycle while on depo but she will have occasional spotting. Pt advised to RTO on 10/11/13 for next injection.

## 2013-07-26 ENCOUNTER — Ambulatory Visit: Payer: Medicaid Other

## 2013-10-07 ENCOUNTER — Telehealth: Payer: Self-pay | Admitting: *Deleted

## 2013-10-07 DIAGNOSIS — Z3042 Encounter for surveillance of injectable contraceptive: Secondary | ICD-10-CM

## 2013-10-07 MED ORDER — MEDROXYPROGESTERONE ACETATE 150 MG/ML IM SUSP: 150.0000 mg | INTRAMUSCULAR | Status: DC

## 2013-10-07 NOTE — Telephone Encounter (Signed)
Patient called requesting refill of her depo provera. Rx sent to the pharmacy- patient needs to schedule annual exam.

## 2013-10-11 ENCOUNTER — Ambulatory Visit (INDEPENDENT_AMBULATORY_CARE_PROVIDER_SITE_OTHER): Payer: Medicaid Other | Admitting: *Deleted

## 2013-10-11 ENCOUNTER — Ambulatory Visit: Payer: Medicaid Other

## 2013-10-11 VITALS — BP 106/71 | HR 68 | Temp 97.5°F | Ht 66.0 in | Wt 125.0 lb

## 2013-10-11 DIAGNOSIS — Z3049 Encounter for surveillance of other contraceptives: Secondary | ICD-10-CM

## 2013-10-11 DIAGNOSIS — Z309 Encounter for contraceptive management, unspecified: Secondary | ICD-10-CM

## 2013-10-11 DIAGNOSIS — Z3042 Encounter for surveillance of injectable contraceptive: Secondary | ICD-10-CM

## 2013-10-11 NOTE — Progress Notes (Signed)
Patient is in the office today for her DEPO Injection. Patient is on time for her Injection. Injection given in Right Deltoid. Patient tolerated well. Patient states she bleeds for a month before her injections. Patient denies any concerns today. Patient notified to return January 02, 2014 for her next DEPO Injection. Patient notified to schedule her appointment with the front and that she would also need an AEX appointment before her next Injection because she is due for one. Patient voiced understanding.  BP 106/71  Pulse 68  Temp(Src) 97.5 F (36.4 C)  Ht 5\' 6"  (1.676 m)  Wt 125 lb (56.7 kg)  BMI 20.19 kg/m2  LMP 09/10/2013  Administrations This Visit   medroxyPROGESTERone (DEPO-PROVERA) injection 150 mg   Administered Action Dose Route Administered By   10/11/2013 Given 150 mg Intramuscular Ladona Ridgel, LPN

## 2013-11-18 ENCOUNTER — Telehealth: Payer: Self-pay | Admitting: *Deleted

## 2013-11-18 NOTE — Telephone Encounter (Signed)
Patient called for RF- but states she thinks she has to have an appointment before. 5:20 Attempted to call patient. She is correct that she has to have an annual exam before her next shot. Left message for her to call to schedule that.

## 2013-11-24 ENCOUNTER — Ambulatory Visit (INDEPENDENT_AMBULATORY_CARE_PROVIDER_SITE_OTHER): Payer: Medicaid Other | Admitting: Obstetrics

## 2013-11-24 ENCOUNTER — Encounter: Payer: Self-pay | Admitting: Obstetrics

## 2013-11-24 ENCOUNTER — Other Ambulatory Visit: Payer: Self-pay | Admitting: Obstetrics

## 2013-11-24 VITALS — BP 111/77 | HR 61 | Temp 98.6°F | Ht 66.0 in | Wt 126.0 lb

## 2013-11-24 DIAGNOSIS — Z0000 Encounter for general adult medical examination without abnormal findings: Secondary | ICD-10-CM

## 2013-11-24 DIAGNOSIS — Z3042 Encounter for surveillance of injectable contraceptive: Secondary | ICD-10-CM

## 2013-11-24 DIAGNOSIS — A6009 Herpesviral infection of other urogenital tract: Secondary | ICD-10-CM | POA: Insufficient documentation

## 2013-11-24 MED ORDER — MEDROXYPROGESTERONE ACETATE 150 MG/ML IM SUSP: 150.0000 mg | INTRAMUSCULAR | Status: DC

## 2013-11-24 MED ORDER — ACYCLOVIR 400 MG PO TABS: 400.0000 mg | ORAL_TABLET | Freq: Two times a day (BID) | ORAL | Status: DC

## 2013-11-24 NOTE — Progress Notes (Signed)
Subjective:     Leah Cruz is a 33 y.o. female here for a routine exam.  Current complaints: Spotting.    Personal health questionnaire:  Is patient [name], have a family history of breast and/or ovarian cancer: no Is there a family history of uterine cancer diagnosed at age < 15, gastrointestinal cancer, urinary tract cancer, family member who is a Field seismologist syndrome-associated carrier: no Is the patient overweight and hypertensive, family history of diabetes, personal history of gestational diabetes or PCOS: no Is patient over 21, have PCOS,  family history of premature CHD under age 3, diabetes, smoke, have hypertension or peripheral artery disease:  no At any time, has a partner hit, kicked or otherwise hurt or frightened you?: no Over the past 2 weeks, have you felt down, depressed or hopeless?: no Over the past 2 weeks, have you felt little interest or pleasure in doing things?:no   Gynecologic History No LMP recorded. Patient has had an injection. Contraception: Depo-Provera injections Last Pap: 2013. Results were: normal Last mammogram: n/a. Results were: n/a  Obstetric History OB History  Gravida Para Term Preterm AB SAB TAB Ectopic Multiple Living  3 2 2  1  1   2     # Outcome Date GA Lbr Len/2nd Weight Sex Delivery Anes PTL Lv  3 Term 02/28/12 [redacted]w[redacted]d 13:33 / 00:45 7 lb 15.5 oz (3.615 kg) M Vag-Spont EPI  Y  2 TAB           1 Term      Vag-Spont EPI  Y      Past Medical History  Diagnosis Date  . HSV-2 infection     Past Surgical History  Procedure Laterality Date  . Breast biopsy    . Wisdom tooth extraction      Current outpatient prescriptions: medroxyPROGESTERone (DEPO-PROVERA) 150 MG/ML injection, Inject 1 mL (150 mg total) into the muscle every 3 (three) months., Disp: 1 mL, Rfl: 0;  acyclovir (ZOVIRAX) 400 MG tablet, Take 1 tablet (400 mg total) by mouth 2 (two) times daily., Disp: 60 tablet, Rfl: prn Current facility-administered medications:  medroxyPROGESTERone (DEPO-PROVERA) injection 150 mg, 150 mg, Intramuscular, Q90 days, Lahoma Crocker, MD, 150 mg at 10/11/13 1336 No Known Allergies  History  Substance Use Topics  . Smoking status: Former Smoker -- 0.50 packs/day for 15 years  . Smokeless tobacco: Not on file  . Alcohol Use: No    History reviewed. No pertinent family history.    Review of Systems  Constitutional: negative for fatigue and weight loss Respiratory: negative for cough and wheezing Cardiovascular: negative for chest pain, fatigue and palpitations Gastrointestinal: negative for abdominal pain and change in bowel habits Musculoskeletal:negative for myalgias Neurological: negative for gait problems and tremors Behavioral/Psych: negative for abusive relationship, depression Endocrine: negative for temperature intolerance   Genitourinary:negative genital lesions, hot flashes, sexual problems and vaginal discharge.  Positive for abnormal period this month Integument/breast: negative for breast lump, breast tenderness, nipple discharge and skin lesion(s)    Objective:       BP 111/77 mmHg  Pulse 61  Temp(Src) 98.6 F (37 C)  Ht 5\' 6"  (1.676 m)  Wt 126 lb (57.153 kg)  BMI 20.35 kg/m2  LMP  General:   alert  Skin:   no rash or abnormalities  Lungs:   clear to auscultation bilaterally  Heart:   regular rate and rhythm, S1, S2 normal, no murmur, click, rub or gallop  Breasts:   normal without suspicious masses, skin  or nipple changes or axillary nodes  Abdomen:  normal findings: no organomegaly, soft, non-tender and no hernia  Pelvis:  External genitalia: normal general appearance Urinary system: urethral meatus normal and bladder without fullness, nontender Vaginal: normal without tenderness, induration or masses Cervix: normal appearance Adnexa: normal bimanual exam Uterus: anteverted and non-tender, normal size   Lab Review Urine pregnancy test Labs reviewed yes Radiologic studies  reviewed no    Assessment:    Healthy female exam.    AUB.  Spotting on Depo Provera.  H/O Genital Herpes   Plan:     Education reviewed: safe sex/STD prevention, self breast exams and management of Genital Herpes. Contraception: Depo-Provera injections. Follow up in: 1 year.   Meds ordered this encounter  Medications  . acyclovir (ZOVIRAX) 400 MG tablet    Sig: Take 1 tablet (400 mg total) by mouth 2 (two) times daily.    Dispense:  60 tablet    Refill:  prn  . medroxyPROGESTERone (DEPO-PROVERA) 150 MG/ML injection    Sig: Inject 1 mL (150 mg total) into the muscle every 3 (three) months.    Dispense:  1 mL    Refill:  0   Orders Placed This Encounter  Procedures  . WET PREP BY MOLECULAR PROBE  . GC/Chlamydia Probe Amp

## 2013-11-25 LAB — GC/CHLAMYDIA PROBE AMP
CT PROBE, AMP APTIMA: NEGATIVE
GC PROBE AMP APTIMA: NEGATIVE

## 2013-11-25 LAB — WET PREP BY MOLECULAR PROBE
Candida species: NEGATIVE
GARDNERELLA VAGINALIS: NEGATIVE
TRICHOMONAS VAG: NEGATIVE

## 2013-11-26 LAB — PAP IG AND HPV HIGH-RISK: HPV DNA High Risk: DETECTED — AB

## 2014-01-03 ENCOUNTER — Ambulatory Visit (INDEPENDENT_AMBULATORY_CARE_PROVIDER_SITE_OTHER): Payer: Medicaid Other | Admitting: *Deleted

## 2014-01-03 VITALS — BP 108/73 | HR 57 | Temp 98.6°F | Ht 66.0 in | Wt 128.0 lb

## 2014-01-03 DIAGNOSIS — Z3042 Encounter for surveillance of injectable contraceptive: Secondary | ICD-10-CM

## 2014-01-03 DIAGNOSIS — Z309 Encounter for contraceptive management, unspecified: Secondary | ICD-10-CM

## 2014-01-03 NOTE — Progress Notes (Signed)
Patient is in the office for her DEPO Injection. Patient is on time for her Injection. Injection given in Right Upper Outer Quadrant. Patient tolerated well. Patient denies any concerns today. Patient notified to make an appointment with the front for March 28, 2014 for her next DEPO Injection. Patient voiced understanding.   BP 108/73 mmHg  Pulse 57  Temp(Src) 98.6 F (37 C)  Ht 5\' 6"  (1.676 m)  Wt 128 lb (58.06 kg)  BMI 20.67 kg/m2  Administrations This Visit    medroxyPROGESTERone (DEPO-PROVERA) injection 150 mg    Administered Action Dose Route Administered By         01/03/2014 Given 150 mg Intramuscular Ladona Ridgel, LPN

## 2014-03-25 ENCOUNTER — Telehealth: Payer: Self-pay | Admitting: *Deleted

## 2014-03-25 ENCOUNTER — Other Ambulatory Visit: Payer: Self-pay | Admitting: Obstetrics

## 2014-03-25 DIAGNOSIS — Z3042 Encounter for surveillance of injectable contraceptive: Secondary | ICD-10-CM

## 2014-03-25 NOTE — Telephone Encounter (Signed)
Pt called to office regarding refill on depo for next appt.  Call placed to pt making her aware that Dr Leah Cruz has sent Rx to pharmacy.

## 2014-03-28 ENCOUNTER — Ambulatory Visit (INDEPENDENT_AMBULATORY_CARE_PROVIDER_SITE_OTHER): Payer: Medicaid Other | Admitting: *Deleted

## 2014-03-28 VITALS — BP 117/78 | HR 63 | Temp 98.3°F | Wt 133.0 lb

## 2014-03-28 DIAGNOSIS — Z3042 Encounter for surveillance of injectable contraceptive: Secondary | ICD-10-CM | POA: Diagnosis not present

## 2014-03-28 MED ORDER — MEDROXYPROGESTERONE ACETATE 150 MG/ML IM SUSP
150.0000 mg | INTRAMUSCULAR | Status: AC
  Administered 2014-03-28 – 2014-06-21 (×2): 150 mg via INTRAMUSCULAR

## 2014-03-28 NOTE — Progress Notes (Signed)
Pt is in office today for depo injection. Pt is on time for injection.  Pt tolerated well.  Pt advised to RTO on 06-19-14 for next injection.  Pt has no other concerns today.    BP 117/78 mmHg  Pulse 63  Temp(Src) 98.3 F (36.8 C)  Wt 133 lb (60.328 kg)  Administrations This Visit    medroxyPROGESTERone (DEPO-PROVERA) injection 150 mg    Admin Date Action Dose Route Administered By         03/28/2014 Given 150 mg Intramuscular Valene Bors, CMA

## 2014-06-21 ENCOUNTER — Ambulatory Visit (INDEPENDENT_AMBULATORY_CARE_PROVIDER_SITE_OTHER): Payer: Medicaid Other | Admitting: *Deleted

## 2014-06-21 VITALS — BP 112/71 | HR 85 | Temp 98.1°F | Ht 66.0 in | Wt 136.0 lb

## 2014-06-21 DIAGNOSIS — Z3042 Encounter for surveillance of injectable contraceptive: Secondary | ICD-10-CM

## 2014-06-21 NOTE — Progress Notes (Signed)
Patient in office today for a Depo Injection. Patient is on time for her injection. Patient tolerated injection well.  Patient is due for next injection on September 12, 2014.  BP 112/71 mmHg  Pulse 85  Temp(Src) 98.1 F (36.7 C)  Ht 5\' 6"  (1.676 m)  Wt 136 lb (61.689 kg)  BMI 21.96 kg/m2   Administrations This Visit    medroxyPROGESTERone (DEPO-PROVERA) injection 150 mg    Admin Date Action Dose Route Administered By         06/21/2014 Given 150 mg Intramuscular Carole Binning, LPN

## 2014-06-30 ENCOUNTER — Emergency Department (HOSPITAL_COMMUNITY)
Admission: EM | Admit: 2014-06-30 | Discharge: 2014-06-30 | Disposition: A | Discharge status: Home / Self Care | Payer: Self-pay | Attending: Emergency Medicine | Admitting: Emergency Medicine

## 2014-06-30 ENCOUNTER — Emergency Department (HOSPITAL_COMMUNITY): Payer: Self-pay

## 2014-06-30 DIAGNOSIS — G43009 Migraine without aura, not intractable, without status migrainosus: Secondary | ICD-10-CM

## 2014-06-30 DIAGNOSIS — F419 Anxiety disorder, unspecified: Secondary | ICD-10-CM | POA: Insufficient documentation

## 2014-06-30 DIAGNOSIS — Z79899 Other long term (current) drug therapy: Secondary | ICD-10-CM | POA: Insufficient documentation

## 2014-06-30 DIAGNOSIS — R4701 Aphasia: Secondary | ICD-10-CM

## 2014-06-30 DIAGNOSIS — G43909 Migraine, unspecified, not intractable, without status migrainosus: Secondary | ICD-10-CM | POA: Insufficient documentation

## 2014-06-30 LAB — RAPID URINE DRUG SCREEN, HOSP PERFORMED
Amphetamines: NOT DETECTED
BENZODIAZEPINES: NOT DETECTED
Barbiturates: NOT DETECTED
Cocaine: NOT DETECTED
Opiates: NOT DETECTED
TETRAHYDROCANNABINOL: POSITIVE — AB

## 2014-06-30 LAB — DIFFERENTIAL
Basophils Absolute: 0 10*3/uL (ref 0.0–0.1)
Basophils Relative: 0 % (ref 0–1)
EOS ABS: 0.1 10*3/uL (ref 0.0–0.7)
Eosinophils Relative: 1 % (ref 0–5)
LYMPHS ABS: 2.8 10*3/uL (ref 0.7–4.0)
LYMPHS PCT: 23 % (ref 12–46)
MONO ABS: 0.5 10*3/uL (ref 0.1–1.0)
Monocytes Relative: 4 % (ref 3–12)
NEUTROS PCT: 72 % (ref 43–77)
Neutro Abs: 8.8 10*3/uL — ABNORMAL HIGH (ref 1.7–7.7)

## 2014-06-30 LAB — URINE MICROSCOPIC-ADD ON

## 2014-06-30 LAB — CBC
HCT: 42.3 % (ref 36.0–46.0)
Hemoglobin: 14.1 g/dL (ref 12.0–15.0)
MCH: 29.3 pg (ref 26.0–34.0)
MCHC: 33.3 g/dL (ref 30.0–36.0)
MCV: 87.9 fL (ref 78.0–100.0)
Platelets: 226 10*3/uL (ref 150–400)
RBC: 4.81 MIL/uL (ref 3.87–5.11)
RDW: 14 % (ref 11.5–15.5)
WBC: 12.2 10*3/uL — ABNORMAL HIGH (ref 4.0–10.5)

## 2014-06-30 LAB — CBG MONITORING, ED: GLUCOSE-CAPILLARY: 102 mg/dL — AB (ref 65–99)

## 2014-06-30 LAB — COMPREHENSIVE METABOLIC PANEL
ALK PHOS: 63 U/L (ref 38–126)
ALT: 25 U/L (ref 14–54)
ANION GAP: 10 (ref 5–15)
AST: 22 U/L (ref 15–41)
Albumin: 3.7 g/dL (ref 3.5–5.0)
BUN: 17 mg/dL (ref 6–20)
CO2: 20 mmol/L — ABNORMAL LOW (ref 22–32)
Calcium: 9.2 mg/dL (ref 8.9–10.3)
Chloride: 112 mmol/L — ABNORMAL HIGH (ref 101–111)
Creatinine, Ser: 0.96 mg/dL (ref 0.44–1.00)
GFR calc non Af Amer: >60 mL/min (ref >60)
GLUCOSE: 84 mg/dL (ref 65–99)
Potassium: 4.2 mmol/L (ref 3.5–5.1)
Sodium: 142 mmol/L (ref 135–145)
TOTAL PROTEIN: 7 g/dL (ref 6.5–8.1)
Total Bilirubin: 0.3 mg/dL (ref 0.3–1.2)

## 2014-06-30 LAB — URINALYSIS, ROUTINE W REFLEX MICROSCOPIC
BILIRUBIN URINE: NEGATIVE
Glucose, UA: NEGATIVE mg/dL
Hgb urine dipstick: NEGATIVE
Ketones, ur: NEGATIVE mg/dL
NITRITE: NEGATIVE
Protein, ur: NEGATIVE mg/dL
SPECIFIC GRAVITY, URINE: 1.009 (ref 1.005–1.030)
UROBILINOGEN UA: 0.2 mg/dL (ref 0.0–1.0)
pH: 7 (ref 5.0–8.0)

## 2014-06-30 LAB — I-STAT CHEM 8, ED
BUN: 19 mg/dL (ref 6–20)
CHLORIDE: 111 mmol/L (ref 101–111)
CREATININE: 1 mg/dL (ref 0.44–1.00)
Calcium, Ion: 1.14 mmol/L (ref 1.12–1.23)
GLUCOSE: 86 mg/dL (ref 65–99)
HCT: 45 % (ref 36.0–46.0)
Hemoglobin: 15.3 g/dL — ABNORMAL HIGH (ref 12.0–15.0)
Potassium: 4.1 mmol/L (ref 3.5–5.1)
SODIUM: 143 mmol/L (ref 135–145)
TCO2: 19 mmol/L (ref 0–100)

## 2014-06-30 LAB — APTT: aPTT: 28 seconds (ref 24–37)

## 2014-06-30 LAB — PROTIME-INR
INR: 0.95 (ref 0.00–1.49)
PROTHROMBIN TIME: 12.9 s (ref 11.6–15.2)

## 2014-06-30 LAB — I-STAT TROPONIN, ED: TROPONIN I, POC: 0.01 ng/mL (ref 0.00–0.08)

## 2014-06-30 LAB — ETHANOL: Alcohol, Ethyl (B): <5 mg/dL (ref <5)

## 2014-06-30 MED ORDER — SODIUM CHLORIDE 0.9 % IV BOLUS (SEPSIS)
1000.0000 mL | Freq: Once | INTRAVENOUS | Status: AC
  Administered 2014-06-30: 1000 mL via INTRAVENOUS

## 2014-06-30 MED ORDER — METOCLOPRAMIDE HCL 5 MG/ML IJ SOLN
10.0000 mg | Freq: Once | INTRAMUSCULAR | Status: AC
  Administered 2014-06-30: 10 mg via INTRAVENOUS
  Filled 2014-06-30: qty 2

## 2014-06-30 MED ORDER — DIPHENHYDRAMINE HCL 50 MG/ML IJ SOLN
25.0000 mg | Freq: Once | INTRAMUSCULAR | Status: AC
  Administered 2014-06-30: 25 mg via INTRAVENOUS
  Filled 2014-06-30: qty 1

## 2014-06-30 MED ORDER — LORAZEPAM 2 MG/ML IJ SOLN
1.0000 mg | Freq: Once | INTRAMUSCULAR | Status: AC
  Administered 2014-06-30: 1 mg via INTRAVENOUS
  Filled 2014-06-30: qty 1

## 2014-06-30 MED ORDER — KETOROLAC TROMETHAMINE 30 MG/ML IJ SOLN
30.0000 mg | Freq: Once | INTRAMUSCULAR | Status: AC
  Administered 2014-06-30: 30 mg via INTRAVENOUS
  Filled 2014-06-30: qty 1

## 2014-06-30 NOTE — Code Documentation (Signed)
34yo female arriving to [facility] via [place] at [phone].  EMS reports that the patient had a headache last night, took Excedrin x4 and headache resolved.  Patient woke up this morning at 0700 at her baseline per patient's boyfriend.  Patient then developed another headache, took Excedrin x4 and then had difficulty speaking.  EMS called and assessed aphasia and then patient began having continuous biting movements.  Patient vomited x1 en route and was given Zofran 4mg  x1.  Code stroke called.  Stroke team to the bedside.  Labs drawn and patient cleared for CT by Dr. Canary Brim.  Patient to CT.  Patient continuing to have biting movements, but this did stop briefly during CT and then resumed.  Patient to B17.  NIHSS 14, see documentation for details and code stroke times.  Patient continues to be nonverbal, but will follow commands.  Patient crying at times.  Dr. Doy Mince at the bedside and code stroke cancelled.  Bedside handoff with ED RN Rodman Key.

## 2014-06-30 NOTE — ED Notes (Addendum)
Unable to review any of pts hx or allergies due to pt not speaking. Primary RN notified.

## 2014-06-30 NOTE — ED Notes (Signed)
Pt remains able to speak, moving all extremities equally. Able to ambulate without difficulty, gait is steady and well balanced. No distress at this time. Denies pain. Educated on ways to keep stress at a minimum. Verbalizes understanding.

## 2014-06-30 NOTE — ED Notes (Addendum)
Pt speaking at this time stating she needs something for her headache. Pain rated 10/10. Hx of migraines as a child. After speaking with patient about stressors, she revealed that her boyfriend is a big stressor for her at this time and before she had her episode of aphasia this morning she got "irritated with him." Pt also states she did not take 4 Excedrin this morning, she only took 1 goody powder.

## 2014-06-30 NOTE — ED Provider Notes (Signed)
CSN: [id number]     Arrival date & time 06/30/14  0826 History   First MD Initiated Contact with Patient 06/30/14 0831     Chief Complaint  Patient presents with  . Code Stroke     (Consider location/radiation/quality/duration/timing/severity/associated sxs/prior Treatment) HPI  A LEVEL 5 CAVEAT PERTAINS DUE TO APHASIA/DIFFICULTY SPEAKING.  Pt presents as a code stroke notification with difficulty speaking.  Per EMS she also c/o headache.    No past medical history on file. No past surgical history on file. No family history on file. History  Substance Use Topics  . Smoking status: Not on file  . Smokeless tobacco: Not on file  . Alcohol Use: Not on file   OB History    No data available     Review of Systems  UNABLE TO OBTAIN ROS DUE TO LEVEL 5 CAVEAT    Allergies  Review of patient's allergies indicates no known allergies.  Home Medications   Prior to Admission medications   Medication Sig Start Date End Date Taking? Authorizing Provider  acyclovir (ZOVIRAX) 400 MG tablet Take 800 mg by mouth daily.   Yes Historical Provider, MD  aspirin-acetaminophen-caffeine (EXCEDRIN MIGRAINE) [phone] MG per tablet Take 2 tablets by mouth every 6 (six) hours as needed for headache.     Historical Provider, MD   BP 123/68 mmHg  Pulse 69  Temp(Src) 98.3 F (36.8 C) (Oral)  Resp 18  SpO2 99%  Vitals reviewed Physical Exam  Physical Examination: General appearance - alert, anxious appearing, and in no distress Mental status - alert, oriented to person, place, and time Eyes - pupils equal and reactive, extraocular eye movements intact Mouth - mucous membranes moist, pharynx normal without lesions Neck - supple, no significant adenopathy Chest - clear to auscultation, no wheezes, rales or rhonchi, symmetric air entry Heart - normal rate, regular rhythm, normal S1, S2, no murmurs, rubs, clicks or gallops Neurological - alert, pt not able to speak, she is tapping her teeth  repeatedly- this is stopped with re-direction.  She points to head when asked if she has pain- on recheck she is awake, alert, conversant, no cranial nerve deficit, strength 5/5 in extrmities x 4, normal gait Extremities - peripheral pulses normal, no pedal edema, no clubbing or cyanosis Skin - normal coloration and turgor, no rashes Psych- anxious appearing, tearful  ED Course  Procedures (including critical care time) Labs Review Labs Reviewed  CBC - Abnormal; Notable for the following:    WBC 12.2 (*)    All other components within normal limits  DIFFERENTIAL - Abnormal; Notable for the following:    Neutro Abs 8.8 (*)    All other components within normal limits  COMPREHENSIVE METABOLIC PANEL - Abnormal; Notable for the following:    Chloride 112 (*)    CO2 20 (*)    All other components within normal limits  URINE RAPID DRUG SCREEN (HOSP PERFORMED) NOT AT [facility] - Abnormal; Notable for the following:    Tetrahydrocannabinol POSITIVE (*)    All other components within normal limits  URINALYSIS, ROUTINE W REFLEX MICROSCOPIC (NOT AT [facility]) - Abnormal; Notable for the following:    Leukocytes, UA SMALL (*)    All other components within normal limits  URINE MICROSCOPIC-ADD ON - Abnormal; Notable for the following:    Squamous Epithelial / LPF FEW (*)    All other components within normal limits  I-STAT CHEM 8, ED - Abnormal; Notable for the following:    Hemoglobin 15.3 (*)  All other components within normal limits  CBG MONITORING, ED - Abnormal; Notable for the following:    Glucose-Capillary 102 (*)    All other components within normal limits  ETHANOL  PROTIME-INR  APTT  I-STAT TROPOININ, ED    Imaging Review Ct Head Wo Contrast  06/30/2014   CLINICAL DATA:  Acute onset of aphasia at 7 a.m.  EXAM: CT HEAD WITHOUT CONTRAST  TECHNIQUE: Contiguous axial images were obtained from the base of the skull through the vertex without intravenous contrast.  COMPARISON:  None.   FINDINGS: No mass lesion. No midline shift. No acute hemorrhage or hematoma. No extra-axial fluid collections. No evidence of acute infarction. Brain parenchyma is normal. No acute osseous abnormality. Mucosal thickening in the paranasal sinuses with an air-fluid level in the left maxillary sinus.  IMPRESSION: No acute abnormality of the brain.  Chronic and acute sinusitis.   Electronically Signed   By: Lorriane Shire M.D.   On: 06/30/2014 08:51     EKG Interpretation   Date/Time:  Thursday June 30 2014 08:48:18 EDT Ventricular Rate:  74 PR Interval:  126 QRS Duration: 87 QT Interval:  372 QTC Calculation: 413 R Axis:   84 Text Interpretation:  Sinus rhythm Baseline wander in lead(s) III No  significant change since last tracing Confirmed by [facility]  MD, Marquasia Schmieder  [phone]) on 06/30/2014 11:45:30 AM      MDM   Final diagnoses:  Nonintractable migraine, unspecified migraine type    Pt presenting as a code stroke notification, she was seen urgently by neurology and code stroke cancelled.  Neuro recommended ativan as there seems to be a heavy component of anxiety with her presentation.  Head CT negative, neurology recommends no further imaging or workup.    9:41 AM pt is now speaking after ativan and asking for medications for her headache. Will give migraine cocktai  11:46 AM headache is improving, pt is stable for discharge  Alfonzo Beers, MD 06/30/14 1320

## 2014-06-30 NOTE — Discharge Instructions (Signed)
Return to the ED with any concerns including vomiting, seizure activity, changes in vision or speech, fainting, decreased level of alertness/lethargy, or any other alarming symptoms

## 2014-06-30 NOTE — ED Notes (Addendum)
Per EMS, pt coming in from home due to inability to speak. Per pts boyfriend the pt had a severe headache last night and took four Excedrin which relieved the pain. Pt woke up this morning normal and per the boyfriend pt unable to speak starting at 7:00 this morning. Pt able to follow commands. Pt received 4mg  of Zofran IM in route for nausea.

## 2014-06-30 NOTE — ED Notes (Signed)
Pt nonverbal at this time, continues to have intermittent biting movements. Boyfriend and father at bedside. Pt crying.

## 2014-06-30 NOTE — Consult Note (Signed)
Referring Physician: Linker    Chief Complaint: Code stroke  HPI:                                                                                                                                         Leah Cruz is an 34 y.o. female --all history obtained from EMS--Patient was suffering from a HA last night and too 4 excedrin. Today she awoke with continued HA and took 4 Excedrin and then vomited.  Per boyfriend she started a teeth chattering like motion. Patietn was brought to [place] as a code stroke. On arrival she was brought to CT. Just prior to CT scan she was was talked to and told to calm down.  As this was occuring her teeth chattering stopped. patient remained non-verbal. After CT patient would intermittently show teeth chattering but began to cry and moan.   Date last known well: Date: 06/30/2014 Time last known well: Time: 07:00 tPA Given: No: not felt to be a stroke   No past medical history on file.---not able to obtain due to patient not talking  No past surgical history on file.---not able to obtain due to patient not talking  No family history on file.--not able to obtain due to patient not talking  Social History:  has no tobacco, alcohol, and drug history on file.  Allergies: Allergies not on file  Medications:                                                                                                                          No current facility-administered medications for this encounter.   No current outpatient prescriptions on file.   --not able to obtain as patient non verbal.   ROS:  History obtained from unobtainable from patient due to mental status and lack of verbal out put   Neurologic Examination:                                                                                                      Blood  pressure 123/77, pulse 83, temperature 98.3 F (36.8 C), temperature source Oral, resp. rate 14, SpO2 100 %.  HEENT-  Normocephalic, no lesions, without obvious abnormality.  Normal external eye and conjunctiva.  Normal TM's bilaterally.  Normal auditory canals and external ears. Normal external nose, mucus membranes and septum.  Normal pharynx. Cardiovascular- S1, S2 normal, pulses palpable throughout   Lungs- chest clear, no wheezing, rales, normal symmetric air entry Abdomen- normal findings: bowel sounds normal Extremities- no edema Lymph-no adenopathy palpable Musculoskeletal-no joint tenderness, deformity or swelling Skin-warm and dry, no hyperpigmentation, vitiligo, or suspicious lesions  Neurological Examination Mental Status: Non-verbal but crying and moaning.   Able to follow verbal commands and visual. Cranial Nerves: II: Discs flat bilaterally; blinks to threat bilaterally, pupils equal, round, reactive to light and accommodation III,IV, VI: ptosis not present, extra-ocular motions intact bilaterally V,VII: smile symmetric, facial light touch sensation normal bilaterally VIII: hearing normal bilaterally IX,X: uvula rises symmetrically XI: bilateral shoulder shrug XII: midline tongue extension Motor: Moving all extremities antigravity --shows limited effort with formal testing.  Allows left arm to fall when held over her head but avoids hitting her head.  Sensory: Pinprick and light touch intact throughout, bilaterally--nods "yes" that they are equal sensation.  Deep Tendon Reflexes: 2+ and symmetric throughout Plantars: Right: downgoing   Left: downgoing Cerebellar: normal finger-to-nose and normal heel-to-shin testing bilaterally Gait: Not tested due to safety concerns    Lab Results: Basic Metabolic Panel:  Recent Labs Lab 06/30/14 0830 06/30/14 0834  NA 142 143  K 4.2 4.1  CL 112* 111  CO2 20*  --   GLUCOSE 84 86  BUN 17 19  CREATININE 0.96 1.00  CALCIUM  9.2  --     Liver Function Tests:  Recent Labs Lab 06/30/14 0830  AST 22  ALT 25  ALKPHOS 63  BILITOT 0.3  PROT 7.0  ALBUMIN 3.7   No results for input(s): LIPASE, AMYLASE in the last 168 hours. No results for input(s): AMMONIA in the last 168 hours.  CBC:  Recent Labs Lab 06/30/14 0830 06/30/14 0834  WBC 12.2*  --   NEUTROABS 8.8*  --   HGB 14.1 15.3*  HCT 42.3 45.0  MCV 87.9  --   PLT 226  --     Cardiac Enzymes: No results for input(s): CKTOTAL, CKMB, CKMBINDEX, TROPONINI in the last 168 hours.  Lipid Panel: No results for input(s): CHOL, TRIG, HDL, CHOLHDL, VLDL, LDLCALC in the last 168 hours.  CBG:  Recent Labs Lab 06/30/14 0859  GLUCAP 102*    Microbiology: No results found for this or any previous visit.  Coagulation Studies:  Recent Labs  06/30/14 0830  LABPROT 12.9  INR 0.95    Imaging: Ct Head Wo Contrast  06/30/2014  CLINICAL DATA:  Acute onset of aphasia at 7 a.m.  EXAM: CT HEAD WITHOUT CONTRAST  TECHNIQUE: Contiguous axial images were obtained from the base of the skull through the vertex without intravenous contrast.  COMPARISON:  None.  FINDINGS: No mass lesion. No midline shift. No acute hemorrhage or hematoma. No extra-axial fluid collections. No evidence of acute infarction. Brain parenchyma is normal. No acute osseous abnormality. Mucosal thickening in the paranasal sinuses with an air-fluid level in the left maxillary sinus.  IMPRESSION: No acute abnormality of the brain.  Chronic and acute sinusitis.   Electronically Signed   By: Lorriane Shire M.D.   On: 06/30/2014 08:51     Etta Quill PA-C [facility] [phone]  06/30/2014, 9:41 AM   Assessment: 34 y.o. female presenting with no verbal output, mouth chewing.  Had complaint of headache earlier today.  Neurological examination exhibits multiple functional features and mouth chewing able to be aborted with voice and mild pressure at times. Head CT personally  reviewed and unremarkable.  Doubt ischemic disease.  Code stroke cancelled.  No further neurological work up indicated at this time.   Stroke Risk Factors - none  Recommendations: 1.  No further neurologic intervention is recommended at this time.  If further questions arise, please call or page at that time.  Thank you for allowing neurology to participate in the care of this patient.  Case discussed with Dr. Canary Brim.  Alexis Goodell, MD [facility] [phone] 06/30/2014  9:44 AM

## 2014-06-30 NOTE — ED Notes (Signed)
Notified RN of CBG 102

## 2014-07-11 ENCOUNTER — Encounter (HOSPITAL_COMMUNITY): Payer: Self-pay | Admitting: Family Medicine

## 2014-07-11 ENCOUNTER — Emergency Department (HOSPITAL_COMMUNITY)
Admission: EM | Admit: 2014-07-11 | Discharge: 2014-07-11 | Discharge status: Against Medical Advice | Payer: Medicaid Other | Attending: Emergency Medicine | Admitting: Emergency Medicine

## 2014-07-11 DIAGNOSIS — R51 Headache: Secondary | ICD-10-CM | POA: Insufficient documentation

## 2014-07-11 DIAGNOSIS — Z5321 Procedure and treatment not carried out due to patient leaving prior to being seen by health care provider: Secondary | ICD-10-CM

## 2014-07-11 DIAGNOSIS — Z87891 Personal history of nicotine dependence: Secondary | ICD-10-CM | POA: Insufficient documentation

## 2014-07-11 NOTE — ED Notes (Signed)
Pt not answering when called for room

## 2014-07-11 NOTE — ED Notes (Signed)
Pt here for ongoing headaches for the past few weeks. sts seen for the same prior and dx with anxiety. sts she takes Excedrin migraine.

## 2014-07-11 NOTE — ED Notes (Signed)
Pt did not answer for room. 

## 2014-07-14 NOTE — ED Provider Notes (Signed)
Patient left without being seen on 07/11/14.   Wandra Arthurs, MD 07/14/14 2108

## 2014-09-12 ENCOUNTER — Ambulatory Visit (INDEPENDENT_AMBULATORY_CARE_PROVIDER_SITE_OTHER): Payer: Medicaid Other | Admitting: *Deleted

## 2014-09-12 VITALS — BP 121/83 | HR 81 | Temp 98.8°F | Wt 136.0 lb

## 2014-09-12 DIAGNOSIS — Z3042 Encounter for surveillance of injectable contraceptive: Secondary | ICD-10-CM

## 2014-09-12 MED ORDER — MEDROXYPROGESTERONE ACETATE 150 MG/ML IM SUSP
150.0000 mg | Freq: Once | INTRAMUSCULAR | Status: AC
  Administered 2014-09-12: 150 mg via INTRAMUSCULAR

## 2014-09-12 NOTE — Progress Notes (Signed)
Pt is in office today for depo injection.  Pt is on time for her depo.  Pt tolerated injection well.  Pt advised to RTO on 12-04-14 for next injection.  Pt is currently scheduled AEX on 11-7, depo was added to that appt.  Pt has no other concerns today.  BP 121/83 mmHg  Pulse 81  Temp(Src) 98.8 F (37.1 C)  Wt 136 lb (61.689 kg)  Administrations This Visit    medroxyPROGESTERone (DEPO-PROVERA) injection 150 mg    Admin Date Action Dose Route Administered By         09/12/2014 Given 150 mg Intramuscular Valene Bors, CMA

## 2014-11-28 ENCOUNTER — Ambulatory Visit (INDEPENDENT_AMBULATORY_CARE_PROVIDER_SITE_OTHER): Payer: Self-pay | Admitting: Obstetrics

## 2014-11-28 ENCOUNTER — Other Ambulatory Visit: Payer: Self-pay | Admitting: Obstetrics

## 2014-11-28 ENCOUNTER — Encounter: Payer: Self-pay | Admitting: Obstetrics

## 2014-11-28 VITALS — BP 111/74 | HR 95 | Temp 98.2°F | Wt 139.0 lb

## 2014-11-28 DIAGNOSIS — A6009 Herpesviral infection of other urogenital tract: Secondary | ICD-10-CM

## 2014-11-28 DIAGNOSIS — Z0000 Encounter for general adult medical examination without abnormal findings: Secondary | ICD-10-CM

## 2014-11-28 DIAGNOSIS — Z3042 Encounter for surveillance of injectable contraceptive: Secondary | ICD-10-CM

## 2014-11-28 DIAGNOSIS — Z01419 Encounter for gynecological examination (general) (routine) without abnormal findings: Secondary | ICD-10-CM

## 2014-11-28 MED ORDER — MEDROXYPROGESTERONE ACETATE 150 MG/ML IM SUSP: INTRAMUSCULAR | Status: DC

## 2014-11-28 MED ORDER — MEDROXYPROGESTERONE ACETATE 150 MG/ML IM SUSP
150.0000 mg | INTRAMUSCULAR | Status: AC
  Administered 2014-11-28 – 2015-02-27 (×2): 150 mg via INTRAMUSCULAR

## 2014-11-28 MED ORDER — ACYCLOVIR 400 MG PO TABS: 400.0000 mg | ORAL_TABLET | Freq: Two times a day (BID) | ORAL | Status: DC

## 2014-11-28 NOTE — Progress Notes (Signed)
Subjective:        Leah Cruz is a 34 y.o. female here for a routine exam.  Current complaints: None.    Personal health questionnaire:  Is patient [name], have a family history of breast and/or ovarian cancer: no Is there a family history of uterine cancer diagnosed at age < 31, gastrointestinal cancer, urinary tract cancer, family member who is a Field seismologist syndrome-associated carrier: no Is the patient overweight and hypertensive, family history of diabetes, personal history of gestational diabetes, preeclampsia or PCOS: no Is patient over 60, have PCOS,  family history of premature CHD under age 4, diabetes, smoke, have hypertension or peripheral artery disease:  no At any time, has a partner hit, kicked or otherwise hurt or frightened you?: no Over the past 2 weeks, have you felt down, depressed or hopeless?: no Over the past 2 weeks, have you felt little interest or pleasure in doing things?:no   Gynecologic History No LMP recorded. Patient has had an injection. Contraception: Depo-Provera injections Last Pap: 2015. Results were: Negative for intraepithelial dysplasia but positive HPV DNA  ( Did not check 16, 18 strains ) Last mammogram: n/a. Results were: n/a  Obstetric History OB History  Gravida Para Term Preterm AB SAB TAB Ectopic Multiple Living  3 2 2  1  1   2     # Outcome Date GA Lbr Len/2nd Weight Sex Delivery Anes PTL Lv  3 Term 02/28/12 [redacted]w[redacted]d 13:33 / 00:45 7 lb 15.5 oz (3.615 kg) M Vag-Spont EPI  Y  2 TAB           1 Term      Vag-Spont EPI  Y      Past Medical History  Diagnosis Date  . HSV-2 infection     Past Surgical History  Procedure Laterality Date  . Breast biopsy    . Wisdom tooth extraction       Current outpatient prescriptions:  .  acyclovir (ZOVIRAX) 400 MG tablet, Take 1 tablet (400 mg total) by mouth 2 (two) times daily., Disp: 60 tablet, Rfl: prn .  aspirin-acetaminophen-caffeine (EXCEDRIN MIGRAINE) 250-250-65 MG per  tablet, Take 2 tablets by mouth every 6 (six) hours as needed for headache. , Disp: , Rfl:  .  medroxyPROGESTERone (DEPO-PROVERA) 150 MG/ML injection, INJECT 1 ML INTO THE MUSCLE EVERY 3 MONTHS, Disp: 1 mL, Rfl: 3  Current facility-administered medications:  .  medroxyPROGESTERone (DEPO-PROVERA) injection 150 mg, 150 mg, Intramuscular, Q90 days, Shelly Bombard, MD, 150 mg at 11/28/14 1643 No Known Allergies  Social History  Substance Use Topics  . Smoking status: Former Smoker -- 0.50 packs/day for 15 years  . Smokeless tobacco: Not on file  . Alcohol Use: No    History reviewed. No pertinent family history.    Review of Systems  Constitutional: negative for fatigue and weight loss Respiratory: negative for cough and wheezing Cardiovascular: negative for chest pain, fatigue and palpitations Gastrointestinal: negative for abdominal pain and change in bowel habits Musculoskeletal:negative for myalgias Neurological: negative for gait problems and tremors Behavioral/Psych: negative for abusive relationship, depression Endocrine: negative for temperature intolerance   Genitourinary:negative for abnormal menstrual periods, genital lesions, hot flashes, sexual problems and vaginal discharge Integument/breast: negative for breast lump, breast tenderness, nipple discharge and skin lesion(s)    Objective:       BP 111/74 mmHg  Pulse 95  Temp(Src) 98.2 F (36.8 C)  Wt 139 lb (63.05 kg) General:   alert  Skin:  no rash or abnormalities  Lungs:   clear to auscultation bilaterally  Heart:   regular rate and rhythm, S1, S2 normal, no murmur, click, rub or gallop  Breasts:   normal without suspicious masses, skin or nipple changes or axillary nodes  Abdomen:  normal findings: no organomegaly, soft, non-tender and no hernia  Pelvis:  External genitalia: normal general appearance Urinary system: urethral meatus normal and bladder without fullness, nontender Vaginal: normal without  tenderness, induration or masses Cervix: normal appearance Adnexa: normal bimanual exam Uterus: anteverted and non-tender, normal size   Lab Review Urine pregnancy test Labs reviewed yes Radiologic studies reviewed no    Assessment:    Healthy female exam.    Contraceptive management.  Pleased with Depo Provera.   Plan:    Education reviewed: low fat, low cholesterol diet, safe sex/STD prevention, self breast exams and weight bearing exercise. Contraception: Depo-Provera injections. Follow up in: 1 year.   Meds ordered this encounter  Medications  . medroxyPROGESTERone (DEPO-PROVERA) 150 MG/ML injection    Sig: INJECT 1 ML INTO THE MUSCLE EVERY 3 MONTHS    Dispense:  1 mL    Refill:  3  . acyclovir (ZOVIRAX) 400 MG tablet    Sig: Take 1 tablet (400 mg total) by mouth 2 (two) times daily.    Dispense:  60 tablet    Refill:  prn  . medroxyPROGESTERone (DEPO-PROVERA) injection 150 mg    Sig:    Orders Placed This Encounter  Procedures  . SureSwab, Vaginosis/Vaginitis Plus

## 2014-11-30 LAB — PAP, TP IMAGING W/ HPV RNA, RFLX HPV TYPE 16,18/45: HPV mRNA, High Risk: DETECTED — AB

## 2014-12-01 LAB — SURESWAB, VAGINOSIS/VAGINITIS PLUS
Atopobium vaginae: NOT DETECTED Log (cells/mL)
C. GLABRATA, DNA: NOT DETECTED
C. PARAPSILOSIS, DNA: NOT DETECTED
C. TROPICALIS, DNA: NOT DETECTED
C. albicans, DNA: NOT DETECTED
C. trachomatis RNA, TMA: NOT DETECTED
GARDNERELLA VAGINALIS: NOT DETECTED Log (cells/mL)
LACTOBACILLUS SPECIES: NOT DETECTED Log (cells/mL)
MEGASPHAERA SPECIES: NOT DETECTED Log (cells/mL)
N. gonorrhoeae RNA, TMA: NOT DETECTED
T. VAGINALIS RNA, QL TMA: NOT DETECTED

## 2014-12-01 LAB — HPV TYPE 16 AND 18/45 RNA
HPV TYPE 16 RNA: NOT DETECTED
HPV TYPE 18/45 RNA: NOT DETECTED

## 2015-02-24 ENCOUNTER — Other Ambulatory Visit: Payer: Self-pay | Admitting: Obstetrics

## 2015-02-27 ENCOUNTER — Ambulatory Visit (INDEPENDENT_AMBULATORY_CARE_PROVIDER_SITE_OTHER): Payer: Medicaid Other | Admitting: *Deleted

## 2015-02-27 VITALS — BP 111/72 | HR 81 | Temp 98.2°F | Wt 138.0 lb

## 2015-02-27 DIAGNOSIS — Z3042 Encounter for surveillance of injectable contraceptive: Secondary | ICD-10-CM | POA: Diagnosis not present

## 2015-02-27 NOTE — Progress Notes (Signed)
Patient in office for a Depo injection. Patient is on time for her injection.  Patient tolerated injection well.  Patient due for next injection May 21, 2015.  BP 111/72 mmHg  Pulse 81  Temp(Src) 98.2 F (36.8 C)  Wt 138 lb (62.596 kg)   Administrations This Visit    medroxyPROGESTERone (DEPO-PROVERA) injection 150 mg    Admin Date Action Dose Route Administered By         02/27/2015 Given 150 mg Intramuscular Carole Binning, LPN

## 2015-05-22 ENCOUNTER — Ambulatory Visit (INDEPENDENT_AMBULATORY_CARE_PROVIDER_SITE_OTHER): Payer: Medicaid Other

## 2015-05-22 DIAGNOSIS — Z3042 Encounter for surveillance of injectable contraceptive: Secondary | ICD-10-CM

## 2015-05-22 NOTE — Progress Notes (Unsigned)
Patient tolerated DEPO injection well in L arm RTO 08-13-15

## 2015-08-14 ENCOUNTER — Ambulatory Visit (INDEPENDENT_AMBULATORY_CARE_PROVIDER_SITE_OTHER): Payer: Medicaid Other | Admitting: *Deleted

## 2015-08-14 VITALS — BP 144/89 | HR 61 | Temp 98.4°F | Wt 139.4 lb

## 2015-08-14 DIAGNOSIS — Z3042 Encounter for surveillance of injectable contraceptive: Secondary | ICD-10-CM

## 2015-11-07 ENCOUNTER — Ambulatory Visit (INDEPENDENT_AMBULATORY_CARE_PROVIDER_SITE_OTHER): Payer: Self-pay | Admitting: *Deleted

## 2015-11-07 VITALS — BP 108/74 | HR 76 | Temp 98.6°F | Wt 139.0 lb

## 2015-11-07 DIAGNOSIS — Z3042 Encounter for surveillance of injectable contraceptive: Secondary | ICD-10-CM

## 2015-11-07 MED ORDER — MEDROXYPROGESTERONE ACETATE 150 MG/ML IM SUSP
150.0000 mg | INTRAMUSCULAR | Status: AC
  Administered 2015-11-07 – 2016-07-22 (×3): 150 mg via INTRAMUSCULAR

## 2016-01-29 ENCOUNTER — Ambulatory Visit (INDEPENDENT_AMBULATORY_CARE_PROVIDER_SITE_OTHER): Payer: Self-pay

## 2016-01-29 ENCOUNTER — Other Ambulatory Visit: Payer: Self-pay | Admitting: Obstetrics

## 2016-01-29 ENCOUNTER — Other Ambulatory Visit: Payer: Self-pay

## 2016-01-29 DIAGNOSIS — Z309 Encounter for contraceptive management, unspecified: Secondary | ICD-10-CM

## 2016-01-29 DIAGNOSIS — Z3042 Encounter for surveillance of injectable contraceptive: Secondary | ICD-10-CM

## 2016-01-29 DIAGNOSIS — A6009 Herpesviral infection of other urogenital tract: Secondary | ICD-10-CM

## 2016-01-29 MED ORDER — ACYCLOVIR 400 MG PO TABS: 400.0000 mg | ORAL_TABLET | Freq: Two times a day (BID) | ORAL | 99 refills | Status: DC

## 2016-01-29 MED ORDER — MEDROXYPROGESTERONE ACETATE 150 MG/ML IM SUSY: 1.0000 mL | PREFILLED_SYRINGE | Freq: Once | INTRAMUSCULAR | 0 refills | Status: DC

## 2016-01-29 NOTE — Progress Notes (Signed)
Patient is in the office for depo injection, administered and patient tolerated well .Marland Kitchen Administrations This Visit    medroxyPROGESTERone (DEPO-PROVERA) injection 150 mg    Admin Date 01/29/2016 Action Given Dose 150 mg Route Intramuscular Administered By Hinton Lovely, RN

## 2016-01-29 NOTE — Telephone Encounter (Signed)
TC from pt requesting Depo rf. Pt last AEX 11/2014. Pt informed we will send in one Depo; however, she needs to make an aex appt before any further rf's are approved. Pt agrees and voices understanding.

## 2016-03-07 ENCOUNTER — Emergency Department (HOSPITAL_COMMUNITY)
Admission: EM | Admit: 2016-03-07 | Discharge: 2016-03-07 | Disposition: A | Discharge status: Home / Self Care | Payer: No Typology Code available for payment source | Attending: Emergency Medicine | Admitting: Emergency Medicine

## 2016-03-07 ENCOUNTER — Encounter (HOSPITAL_COMMUNITY): Payer: Self-pay | Admitting: Emergency Medicine

## 2016-03-07 DIAGNOSIS — Y999 Unspecified external cause status: Secondary | ICD-10-CM | POA: Insufficient documentation

## 2016-03-07 DIAGNOSIS — Z7982 Long term (current) use of aspirin: Secondary | ICD-10-CM | POA: Diagnosis not present

## 2016-03-07 DIAGNOSIS — S3992XA Unspecified injury of lower back, initial encounter: Secondary | ICD-10-CM | POA: Insufficient documentation

## 2016-03-07 DIAGNOSIS — Y92410 Unspecified street and highway as the place of occurrence of the external cause: Secondary | ICD-10-CM | POA: Insufficient documentation

## 2016-03-07 DIAGNOSIS — Y939 Activity, unspecified: Secondary | ICD-10-CM | POA: Insufficient documentation

## 2016-03-07 DIAGNOSIS — Z87891 Personal history of nicotine dependence: Secondary | ICD-10-CM | POA: Diagnosis not present

## 2016-03-07 DIAGNOSIS — M7918 Myalgia, other site: Secondary | ICD-10-CM

## 2016-03-07 MED ORDER — NAPROXEN 500 MG PO TABS: 500.0000 mg | ORAL_TABLET | Freq: Two times a day (BID) | ORAL | 0 refills | Status: DC

## 2016-03-07 MED ORDER — METHOCARBAMOL 500 MG PO TABS: 500.0000 mg | ORAL_TABLET | Freq: Two times a day (BID) | ORAL | 0 refills | Status: DC

## 2016-03-07 NOTE — Discharge Instructions (Signed)
Take your medications as prescribed. I also recommend applying ice and/or heat to affected area for 15-20 minutes 3-4 times daily for additional pain relief. Refrain from doing any heavy lifting, squatting or repetitive movements that exacerbate your symptoms. °Follow-up with your primary care provider in the next week if her symptoms have not improved.  °Please return to the Emergency Department if symptoms worsen or new onset of fever, numbness, tingling, groin anesthesia, loss of bowel or bladder, weakness. °
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
## 2016-04-15 ENCOUNTER — Telehealth: Payer: Self-pay | Admitting: Obstetrics

## 2016-04-22 ENCOUNTER — Ambulatory Visit (INDEPENDENT_AMBULATORY_CARE_PROVIDER_SITE_OTHER): Payer: Self-pay

## 2016-04-22 ENCOUNTER — Other Ambulatory Visit: Payer: Self-pay | Admitting: Obstetrics

## 2016-04-22 VITALS — BP 116/74 | HR 72 | Wt 145.1 lb

## 2016-04-22 DIAGNOSIS — Z3042 Encounter for surveillance of injectable contraceptive: Secondary | ICD-10-CM

## 2016-04-22 NOTE — Telephone Encounter (Signed)
Patient is overdue annual exam

## 2016-04-22 NOTE — Progress Notes (Signed)
Patient is in the office for depo administration, administered and pt tolerated well.

## 2016-05-14 NOTE — Telephone Encounter (Signed)
Please call patient to schedule annual exam.  

## 2016-05-15 ENCOUNTER — Encounter: Payer: Self-pay | Admitting: *Deleted

## 2016-07-17 ENCOUNTER — Other Ambulatory Visit: Payer: Self-pay

## 2016-07-17 ENCOUNTER — Other Ambulatory Visit: Payer: Self-pay | Admitting: Obstetrics

## 2016-07-17 DIAGNOSIS — Z3042 Encounter for surveillance of injectable contraceptive: Secondary | ICD-10-CM

## 2016-07-17 MED ORDER — MEDROXYPROGESTERONE ACETATE 150 MG/ML IM SUSP: 150.0000 mg | Freq: Once | INTRAMUSCULAR | 0 refills | Status: DC

## 2016-07-17 NOTE — Progress Notes (Signed)
Pt scheduled for annual 07/22/16 needs rf for Depo. Pt did not miss any injections. Pt aware Depo sent to [facility] no rf's until she reports to appt on 07/22/16 per protocol. Pt agrees and voices understanding.

## 2016-07-22 ENCOUNTER — Encounter: Payer: Self-pay | Admitting: Obstetrics

## 2016-07-22 ENCOUNTER — Ambulatory Visit: Payer: Self-pay

## 2016-07-22 ENCOUNTER — Ambulatory Visit (INDEPENDENT_AMBULATORY_CARE_PROVIDER_SITE_OTHER): Payer: Self-pay | Admitting: Obstetrics

## 2016-07-22 VITALS — BP 134/90 | HR 94 | Ht 65.0 in | Wt 142.2 lb

## 2016-07-22 DIAGNOSIS — Z01419 Encounter for gynecological examination (general) (routine) without abnormal findings: Secondary | ICD-10-CM

## 2016-07-22 DIAGNOSIS — Z3042 Encounter for surveillance of injectable contraceptive: Secondary | ICD-10-CM

## 2016-07-22 DIAGNOSIS — Z124 Encounter for screening for malignant neoplasm of cervix: Secondary | ICD-10-CM

## 2016-07-22 NOTE — Progress Notes (Signed)
Subjective:        Leah Cruz is a 36 y.o. female here for a routine exam.  Current complaints: None.    Personal health questionnaire:  Is patient Leah Cruz, have a family history of breast and/or ovarian cancer: yes, mother Breast CA survivor Is there a family history of uterine cancer diagnosed at age < 56, gastrointestinal cancer, urinary tract cancer, family member who is a Field seismologist syndrome-associated carrier: no Is the patient overweight and hypertensive, family history of diabetes, personal history of gestational diabetes, preeclampsia or PCOS: no Is patient over 60, have PCOS,  family history of premature CHD under age 67, diabetes, smoke, have hypertension or peripheral artery disease:  no At any time, has a partner hit, kicked or otherwise hurt or frightened you?: no Over the past 2 weeks, have you felt down, depressed or hopeless?: no Over the past 2 weeks, have you felt little interest or pleasure in doing things?:no   Gynecologic History No LMP recorded. Patient has had an injection. Contraception: Depo-Provera injections Last Pap: 2016. Results were: normal Last mammogram: n/a. Results were: n/a  Obstetric History OB History  Gravida Para Term Preterm AB Living  3 2 2   1 2   SAB TAB Ectopic Multiple Live Births    1     2    # Outcome Date GA Lbr Len/2nd Weight Sex Delivery Anes PTL Lv  3 Term 02/28/12 [redacted]w[redacted]d 13:33 / 00:45 7 lb 15.5 oz (3.615 kg) M Vag-Spont EPI  LIV     Birth Comments: CAH--Normal GAL--Normal Thyroid-Normal Biotinidase- Normal Hemoglobin--Normal, FA Amino Acid Profile-Normal Acylcarnitine Profile-Normal   2 TAB           1 Term      Vag-Spont EPI  LIV      Past Medical History:  Diagnosis Date  . HSV-2 infection     Past Surgical History:  Procedure Laterality Date  . BREAST BIOPSY    . WISDOM TOOTH EXTRACTION       Current Outpatient Prescriptions:  .  acyclovir (ZOVIRAX) 400 MG tablet, Take 1 tablet (400 mg total)  by mouth 2 (two) times daily., Disp: 60 tablet, Rfl: prn .  medroxyPROGESTERone (DEPO-PROVERA) 150 MG/ML injection, USE AS DIRECTED, Disp: 1 mL, Rfl: 0 .  aspirin-acetaminophen-caffeine (EXCEDRIN MIGRAINE) [phone] MG per tablet, Take 2 tablets by mouth every 6 (six) hours as needed for headache. Reported on 02/27/2015, Disp: , Rfl:  .  medroxyPROGESTERone (DEPO-PROVERA) 150 MG/ML injection, Inject 1 mL (150 mg total) into the muscle once., Disp: 1 mL, Rfl: 0 .  MedroxyPROGESTERone Acetate 150 MG/ML SUSY, INJECT INTRAMUSCULARLY (Patient not taking: Reported on 07/22/2016), Disp: 1 Syringe, Rfl: 0 .  methocarbamol (ROBAXIN) 500 MG tablet, Take 1 tablet (500 mg total) by mouth 2 (two) times daily. (Patient not taking: Reported on 07/22/2016), Disp: 20 tablet, Rfl: 0 .  naproxen (NAPROSYN) 500 MG tablet, Take 1 tablet (500 mg total) by mouth 2 (two) times daily. (Patient not taking: Reported on 07/22/2016), Disp: 30 tablet, Rfl: 0  Current Facility-Administered Medications:  .  medroxyPROGESTERone (DEPO-PROVERA) injection 150 mg, 150 mg, Intramuscular, Q90 days, Shelly Bombard, MD, 150 mg at 01/29/16 1520 No Known Allergies  Social History  Substance Use Topics  . Smoking status: Former Smoker    Packs/day: 0.50    Years: 15.00  . Smokeless tobacco: Never Used  . Alcohol use No    History reviewed. No pertinent family history.    Review of  Systems  Constitutional: negative for fatigue and weight loss Respiratory: negative for cough and wheezing Cardiovascular: negative for chest pain, fatigue and palpitations Gastrointestinal: negative for abdominal pain and change in bowel habits Musculoskeletal:negative for myalgias Neurological: negative for gait problems and tremors Behavioral/Psych: negative for abusive relationship, depression Endocrine: negative for temperature intolerance    Genitourinary:negative for abnormal menstrual periods, genital lesions, hot flashes, sexual problems and  vaginal discharge Integument/breast: negative for breast lump, breast tenderness, nipple discharge and skin lesion(s)    Objective:       BP 134/90   Pulse 94   Ht 5\' 5"  (1.651 m)   Wt 142 lb 3.2 oz (64.5 kg)   BMI 23.66 kg/m  General:   alert  Skin:   no rash or abnormalities  Lungs:   clear to auscultation bilaterally  Heart:   regular rate and rhythm, S1, S2 normal, no murmur, click, rub or gallop  Breasts:   normal without suspicious masses, skin or nipple changes or axillary nodes  Abdomen:  normal findings: no organomegaly, soft, non-tender and no hernia  Pelvis:  External genitalia: normal general appearance Urinary system: urethral meatus normal and bladder without fullness, nontender Vaginal: normal without tenderness, induration or masses Cervix: normal appearance Adnexa: normal bimanual exam Uterus: anteverted and non-tender, normal size   Lab Review Urine pregnancy test Labs reviewed yes Radiologic studies reviewed yes  50% of 20 min visit spent on counseling and coordination of care.    Assessment:    Healthy female exam.    Plan:    Education reviewed: calcium supplements, depression evaluation, low fat, low cholesterol diet, safe sex/STD prevention, self breast exams and weight bearing exercise. Contraception: Depo-Provera injections. Follow up in: 1 year.   No orders of the defined types were placed in this encounter.  No orders of the defined types were placed in this encounter.       Patient ID: Leah Cruz, female   DOB: 12/02/1980, 36 y.o.   MRN: [id number]

## 2016-07-22 NOTE — Progress Notes (Signed)
Patient is in the office for annual exam, last pap 11-28-14. Administered depo and pt tolerated well .Marland Kitchen Administrations This Visit    medroxyPROGESTERone (DEPO-PROVERA) injection 150 mg    Admin Date 07/22/2016 Action Given Dose 150 mg Route Intramuscular Administered By Hinton Lovely, RN

## 2016-07-25 LAB — CYTOLOGY - PAP

## 2016-09-05 ENCOUNTER — Encounter (HOSPITAL_COMMUNITY): Payer: Self-pay

## 2016-09-05 ENCOUNTER — Ambulatory Visit (HOSPITAL_COMMUNITY)
Admission: RE | Admit: 2016-09-05 | Discharge: 2016-09-05 | Disposition: A | Discharge status: Home / Self Care | Payer: Self-pay | Source: Ambulatory Visit | Attending: Obstetrics and Gynecology | Admitting: Obstetrics and Gynecology

## 2016-09-05 VITALS — BP 128/84 | HR 75 | Temp 98.4°F | Ht 66.5 in | Wt 135.0 lb

## 2016-09-05 DIAGNOSIS — R87612 Low grade squamous intraepithelial lesion on cytologic smear of cervix (LGSIL): Secondary | ICD-10-CM

## 2016-09-05 DIAGNOSIS — Z1239 Encounter for other screening for malignant neoplasm of breast: Secondary | ICD-10-CM

## 2016-09-05 NOTE — Addendum Note (Signed)
Encounter addended by: Loletta Parish, RN on: 09/05/2016  3:00 PM<BR>    Actions taken: Sign clinical note

## 2016-09-05 NOTE — Patient Instructions (Signed)
Explained breast self awareness with Leah Cruz. Patient did not need a Pap smear today due to last Pap smear was 07/22/2016. Explained the colposcopy the recommended follow up for her abnormal Pap smear. Patient referred to the [facility] [name] [facility] for a colpscopy. Appointment scheduled for Wednesday, September 11, 2016 at 1020. Patient aware of appointment and will be there. Let patient know that she will need a screening mammogram at age 78 unless clinically indicated prior. Patient unsure if follow up is recommended from her left breast biopsy in 2002. Told patient she will need to get her records and if follow up is recommended then [facility] will cover. [name] verbalized understanding.  Chanese Hartsough, Arvil Chaco, RN 2:39 PM

## 2016-09-05 NOTE — Progress Notes (Addendum)
Patient referred to [facility] by [facility] due to having an abnormal Pap smear 07/22/2016 that a colposcopy is recommended for follow up.  Pap Smear: Pap smear not completed today. Last Pap smear was 07/22/2016 at [facility] and LGSIL with positive HPV. Patient referred to the [facility] [place] [facility] for a colpscopy. Appointment scheduled for Wednesday, September 11, 2016 at 1020. Per patient has no history of an abnormal Pap smear prior to the most recent Pap smear. Last Pap smear result is in EPIC.  Physical exam: Breasts Breasts symmetrical. No skin abnormalities right breast. Scar observed on left breast at 12 o'clock above nipple due to history of left breast biopsy in 2002. No nipple retraction bilateral breasts. No nipple discharge bilateral breasts. No lymphadenopathy. No lumps palpated bilateral breasts. No complaints of pain or tenderness on exam. Screening mammogram recommended at age 36 unless clinically indicated prior.        Pelvic/Bimanual No Pap smear completed today since last Pap smear was 07/22/2016. Pap smear not indicated per [facility] guidelines.   Smoking History: Patient is a former smoker that quit 15 years ago.  Patient Navigation: Patient education provided. Access to services provided for patient through [facility] program.

## 2016-09-11 ENCOUNTER — Encounter: Payer: Self-pay | Admitting: Obstetrics & Gynecology

## 2016-09-11 ENCOUNTER — Ambulatory Visit (INDEPENDENT_AMBULATORY_CARE_PROVIDER_SITE_OTHER): Payer: Self-pay | Admitting: Obstetrics & Gynecology

## 2016-09-11 ENCOUNTER — Other Ambulatory Visit (HOSPITAL_COMMUNITY)
Admission: RE | Admit: 2016-09-11 | Discharge: 2016-09-11 | Disposition: A | Discharge status: Home / Self Care | Payer: No Typology Code available for payment source | Source: Ambulatory Visit | Attending: Obstetrics & Gynecology | Admitting: Obstetrics & Gynecology

## 2016-09-11 DIAGNOSIS — R87612 Low grade squamous intraepithelial lesion on cytologic smear of cervix (LGSIL): Secondary | ICD-10-CM | POA: Insufficient documentation

## 2016-09-11 DIAGNOSIS — Z3202 Encounter for pregnancy test, result negative: Secondary | ICD-10-CM

## 2016-09-11 DIAGNOSIS — D069 Carcinoma in situ of cervix, unspecified: Secondary | ICD-10-CM

## 2016-09-11 DIAGNOSIS — D060 Carcinoma in situ of endocervix: Secondary | ICD-10-CM | POA: Insufficient documentation

## 2016-09-11 LAB — POCT PREGNANCY, URINE: Preg Test, Ur: NEGATIVE

## 2016-09-11 NOTE — Patient Instructions (Signed)

## 2016-09-11 NOTE — Progress Notes (Signed)
Patient ID: Leah Cruz, female   DOB: 01-19-1981, 36 y.o.   MRN: [id number]  Chief Complaint  Patient presents with  . Colposcopy    HPI Leah Cruz is a 36 y.o. female.  [id number] No LMP recorded. Patient has had an injection.  HPI  Indications: Pap smear on July 2018 showed: low-grade squamous intraepithelial neoplasia (LGSIL - encompassing HPV,mild dysplasia,CIN I). Previous colposcopy: no. Prior cervical treatment: no treatment.  Past Medical History:  Diagnosis Date  . HSV-2 infection     Past Surgical History:  Procedure Laterality Date  . BREAST BIOPSY    . WISDOM TOOTH EXTRACTION      No family history on file.  Social History Social History  Substance Use Topics  . Smoking status: Former Smoker    Packs/day: 0.50    Years: 15.00  . Smokeless tobacco: Never Used  . Alcohol use No    No Known Allergies  Current Outpatient Prescriptions  Medication Sig Dispense Refill  . MedroxyPROGESTERone Acetate 150 MG/ML SUSY INJECT INTRAMUSCULARLY 1 Syringe 0  . acyclovir (ZOVIRAX) 400 MG tablet Take 1 tablet (400 mg total) by mouth 2 (two) times daily. (Patient not taking: Reported on 09/11/2016) 60 tablet prn  . aspirin-acetaminophen-caffeine (EXCEDRIN MIGRAINE) [phone] MG per tablet Take 2 tablets by mouth every 6 (six) hours as needed for headache. Reported on 02/27/2015    . medroxyPROGESTERone (DEPO-PROVERA) 150 MG/ML injection USE AS DIRECTED 1 mL 0  . medroxyPROGESTERone (DEPO-PROVERA) 150 MG/ML injection Inject 1 mL (150 mg total) into the muscle once. 1 mL 0  . methocarbamol (ROBAXIN) 500 MG tablet Take 1 tablet (500 mg total) by mouth 2 (two) times daily. (Patient not taking: Reported on 07/22/2016) 20 tablet 0  . naproxen (NAPROSYN) 500 MG tablet Take 1 tablet (500 mg total) by mouth 2 (two) times daily. (Patient not taking: Reported on 07/22/2016) 30 tablet 0   Current Facility-Administered Medications  Medication Dose Route Frequency Provider Last Rate Last  Dose  . medroxyPROGESTERone (DEPO-PROVERA) injection 150 mg  150 mg Intramuscular Q90 days Shelly Bombard, MD   150 mg at 07/22/16 1441    Review of Systems Review of Systems  Blood pressure 123/72, pulse 79, weight 132 lb (59.9 kg).  Physical Exam Physical Exam  Data Reviewed Pap and HPV results  Assessment    Procedure Details  The risks and benefits of the procedure and Written informed consent obtained.  Speculum placed in vagina and excellent visualization of cervix achieved, cervix swabbed x 3 with acetic acid solution. AWE anterior and posterior with punctation, no endocervical lesion, SCJ and TZ seen Specimens: ECC and Bx at 12 and 6, Monsel's solution applied  Complications: none.     Plan    Specimens labelled and sent to Pathology. Call to discuss Pathology results in 2 weeks.      Emeterio Reeve 09/11/2016, 11:01 AM

## 2016-10-03 ENCOUNTER — Telehealth: Payer: Self-pay | Admitting: General Practice

## 2016-10-03 NOTE — Telephone Encounter (Signed)
Reviewed patient's colpo results with Dr Gala Romney and she recommends LEEP for patient. Called patient and informed of her colpo results & explained LEEP to her. Patient verbalized understanding to all and will await phone call from front office with appt.

## 2016-10-07 ENCOUNTER — Ambulatory Visit (INDEPENDENT_AMBULATORY_CARE_PROVIDER_SITE_OTHER): Payer: No Typology Code available for payment source

## 2016-10-07 DIAGNOSIS — Z3042 Encounter for surveillance of injectable contraceptive: Secondary | ICD-10-CM

## 2016-10-07 MED ORDER — LEVONORGESTREL 19.5 MG IU IUD: INTRAUTERINE_SYSTEM | Freq: Two times a day (BID) | INTRAUTERINE | Status: DC

## 2016-10-07 MED ORDER — MEDROXYPROGESTERONE ACETATE 150 MG/ML IM SUSP
150.0000 mg | Freq: Once | INTRAMUSCULAR | Status: AC
  Administered 2016-10-07: 150 mg via INTRAMUSCULAR

## 2016-10-07 NOTE — Progress Notes (Signed)
Agree with nursing staff's documentation of this patient's clinic encounter.  Leandria Thier, MD    

## 2016-10-07 NOTE — Progress Notes (Signed)
Pt here for a depo. Inj given in left upper otter quad.  Pt advised to return in 3 months for next depo. Pt tolerated well

## 2016-10-07 NOTE — Addendum Note (Signed)
Addended by: Maryruth Eve on: 10/07/2016 03:46 PM   Modules accepted: Orders

## 2016-11-07 ENCOUNTER — Ambulatory Visit (INDEPENDENT_AMBULATORY_CARE_PROVIDER_SITE_OTHER): Payer: Self-pay | Admitting: Obstetrics & Gynecology

## 2016-11-07 ENCOUNTER — Other Ambulatory Visit (HOSPITAL_COMMUNITY)
Admission: RE | Admit: 2016-11-07 | Discharge: 2016-11-07 | Disposition: A | Discharge status: Home / Self Care | Payer: Self-pay | Source: Ambulatory Visit | Attending: Obstetrics & Gynecology | Admitting: Obstetrics & Gynecology

## 2016-11-07 ENCOUNTER — Encounter: Payer: Self-pay | Admitting: Obstetrics & Gynecology

## 2016-11-07 DIAGNOSIS — D069 Carcinoma in situ of cervix, unspecified: Secondary | ICD-10-CM

## 2016-11-07 DIAGNOSIS — Z3202 Encounter for pregnancy test, result negative: Secondary | ICD-10-CM

## 2016-11-07 LAB — POCT PREGNANCY, URINE: PREG TEST UR: NEGATIVE

## 2016-11-07 NOTE — Progress Notes (Signed)
HPI [id number]  Indications: Pap smear on July 2018 showed: low-grade squamous intraepithelial neoplasia (LGSIL - encompassing HPV,mild dysplasia,CIN I). Previous colposcopy: no. Prior cervical treatment: no treatment. 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 HSV-2 infection          Past Surgical History:  Procedure Laterality Date  . BREAST BIOPSY    . WISDOM TOOTH EXTRACTION      No family history on file.  Social History      Social History  Substance Use Topics  . Smoking status: Former Smoker    Packs/day: 0.50    Years: 15.00  . Smokeless tobacco: Never Used  . Alcohol use No    No Known Allergies        Current Outpatient Prescriptions  Medication Sig Dispense Refill  . MedroxyPROGESTERone Acetate 150 MG/ML SUSY INJECT INTRAMUSCULARLY 1 Syringe 0  . acyclovir (ZOVIRAX) 400 MG tablet Take 1 tablet (400 mg total) by mouth 2 (two) times daily. (Patient not taking: Reported on 09/11/2016) 60 tablet prn  . aspirin-acetaminophen-caffeine (EXCEDRIN MIGRAINE) [phone] MG per tablet Take 2 tablets by mouth every 6 (six) hours as needed for headache. Reported on 02/27/2015    . medroxyPROGESTERone (DEPO-PROVERA) 150 MG/ML injection USE AS DIRECTED 1 mL 0  . medroxyPROGESTERone (DEPO-PROVERA) 150 MG/ML injection Inject 1 mL (150 mg total) into the muscle once. 1 mL 0  . methocarbamol (ROBAXIN) 500 MG tablet Take 1 tablet (500 mg total) by mouth 2 (two) times daily. (Patient not taking: Reported on 07/22/2016) 20 tablet 0  . naproxen (NAPROSYN) 500 MG tablet Take 1 tablet (500 mg total) by mouth 2 (two) times daily. (Patient not taking: Reported on 07/22/2016) 30 tablet 0            Current Facility-Administered Medications  Medication Dose Route Frequency Provider Last Rate Last Dose  . medroxyPROGESTERone (DEPO-PROVERA) injection 150 mg  150 mg Intramuscular Q90 days Shelly Bombard, MD   150 mg at 07/22/16  1441     Patient identified, informed consent obtained, signed copy in chart, time out performed.  Pap smear and colposcopy reviewed.   Pap LSIL Colpo Biopsy CIN 3 ECC CIN 3 Teflon coated speculum with smoke evacuator placed.  Cervix visualized. Paracervical block placed.  Medium Fischer size loop used to remove cone of cervix using blend of cut and cautery on LEEP machine.  Edges/Base cauterized with Ball.  Monsel's solution used for hemostasis.  Patient tolerated procedure well.  Patient given post procedure instructions.  Follow up in 6-12 months for repeat pap or as needed.  Woodroe Mode, MD 11/07/2016

## 2016-11-07 NOTE — Patient Instructions (Signed)
Cervical Conization, Care After °This sheet gives you information about how to care for yourself after your procedure. Your doctor may also give you more specific instructions. If you have problems or questions, contact your doctor. °Follow these instructions at home: °Medicines °· Take over-the-counter and prescription medicines only as told by your doctor. °· Do not take aspirin until your doctor says it is okay. °· If you take pain medicine: °? You may have constipation. To help treat this, your doctor may tell you to: °§ Drink enough fluid to keep your pee (urine) clear or pale yellow. °§ Take medicines. °§ Eat foods that are high in fiber. These include fresh fruits and vegetables, whole grains, bran, and beans. °§ Limit foods that are high in fat and sugar. These include fried foods and sweet foods. °? Do not drive or use heavy machines. °General instructions °· You can eat your usual diet unless your doctor tells you not to do so. °· Take showers for the first week. Do not take baths, swim, or use hot tubs until your doctor says it is okay. °· Do not douche, use tampons, or have sex until your doctor says it is okay. °· For 7-14 days after your procedure, avoid: °? Being very active. °? Exercising. °? Heavy lifting. °· Keep all follow-up visits as told by your doctor. This is important. °Contact a doctor if: °· You have a rash. °· You are dizzy or lightheaded. °· You feel sick to your stomach (nauseous). °· You throw up (vomit). °· You have fluid from your vagina (vaginal discharge) that smells bad. °Get help right away if: °· There are blood clots coming from your vagina. °· You have more bleeding than you would have in a normal period. For example, you soak a pad in less than 1 hour. °· You have a fever. °· You have more and more cramps. °· You pass out (faint). °· You have pain when peeing. °· Your have a lot of pain. °· Your pain gets worse. °· Your pain does not get better when you take your  medicine. °· You have blood in your pee. °· You throw up (vomit). °Summary °· After your procedure, take over-the-counter and prescription medicines only as told by your doctor. °· Do not douche, use tampons, or have sex until your doctor says it is okay. °· For about 7-14 days after your procedure, try not to exercise or lift heavy objects. °· Get help right away if you have new symptoms, or if your symptoms become worse. °This information is not intended to replace advice given to you by your health care provider. Make sure you discuss any questions you have with your health care provider. °Document Released: 10/17/2007 Document Revised: 01/10/2016 Document Reviewed: 01/10/2016 °Elsevier Interactive Patient Education © 2017 [employer] ° °

## 2016-11-14 ENCOUNTER — Encounter: Payer: Self-pay | Admitting: Obstetrics & Gynecology

## 2016-12-11 ENCOUNTER — Encounter (HOSPITAL_COMMUNITY): Payer: Self-pay | Admitting: *Deleted

## 2016-12-31 ENCOUNTER — Ambulatory Visit: Payer: No Typology Code available for payment source

## 2017-01-01 ENCOUNTER — Other Ambulatory Visit: Payer: Self-pay | Admitting: Obstetrics

## 2017-01-01 ENCOUNTER — Other Ambulatory Visit: Payer: Self-pay

## 2017-01-01 ENCOUNTER — Ambulatory Visit (INDEPENDENT_AMBULATORY_CARE_PROVIDER_SITE_OTHER): Payer: Self-pay

## 2017-01-01 VITALS — BP 121/85

## 2017-01-01 DIAGNOSIS — Z3042 Encounter for surveillance of injectable contraceptive: Secondary | ICD-10-CM

## 2017-01-01 MED ORDER — MEDROXYPROGESTERONE ACETATE 150 MG/ML IM SUSP
150.0000 mg | Freq: Once | INTRAMUSCULAR | Status: AC
  Administered 2017-01-01: 150 mg via INTRAMUSCULAR

## 2017-01-01 MED ORDER — MEDROXYPROGESTERONE ACETATE 150 MG/ML IM SUSP: INTRAMUSCULAR | 0 refills | Status: DC

## 2017-01-01 NOTE — Progress Notes (Signed)
Pt needs rf for Depo. She is on time for the inj and has an updated annual/pap visit on file. She has an appt today and is currently at the pharmacy waiting for rx. Depo sent to pharmacy per protocol. Pt agrees.

## 2017-01-01 NOTE — Progress Notes (Signed)
Pt here for a depo shot. Last depo was 10/07/16. Inj given in right upper outter quad. Pt tolerated well. Next Depo due 2/27-3/13.

## 2017-02-17 ENCOUNTER — Other Ambulatory Visit: Payer: Self-pay | Admitting: Obstetrics

## 2017-02-17 DIAGNOSIS — A6009 Herpesviral infection of other urogenital tract: Secondary | ICD-10-CM

## 2017-02-18 NOTE — Telephone Encounter (Signed)
Take one tablet by mouth bid prn

## 2017-03-20 ENCOUNTER — Other Ambulatory Visit: Payer: Self-pay | Admitting: Obstetrics

## 2017-03-24 ENCOUNTER — Ambulatory Visit: Payer: Self-pay

## 2017-03-24 DIAGNOSIS — Z3042 Encounter for surveillance of injectable contraceptive: Secondary | ICD-10-CM

## 2017-03-24 MED ORDER — MEDROXYPROGESTERONE ACETATE 150 MG/ML IM SUSP
150.0000 mg | Freq: Once | INTRAMUSCULAR | Status: AC
  Administered 2017-03-24: 150 mg via INTRAMUSCULAR

## 2017-03-24 NOTE — Progress Notes (Signed)
Pt here for depo. Next depo due 5/30-6/3. Ing given in left upper outer quad. Pt tolerated well.

## 2017-06-10 ENCOUNTER — Ambulatory Visit: Payer: Self-pay

## 2017-06-10 DIAGNOSIS — Z3042 Encounter for surveillance of injectable contraceptive: Secondary | ICD-10-CM

## 2017-06-10 MED ORDER — MEDROXYPROGESTERONE ACETATE 150 MG/ML IM SUSP
150.0000 mg | Freq: Once | INTRAMUSCULAR | Status: AC
  Administered 2017-06-10: 150 mg via INTRAMUSCULAR

## 2017-06-10 NOTE — Progress Notes (Signed)
Pt presents for Depo Injection   Last Depo was : 03/24/17  Next Depo Due 08/26/17 - 09/09/17  Given in Rt upper Outer Quad  Pt supplied.

## 2017-06-25 ENCOUNTER — Encounter (HOSPITAL_COMMUNITY): Payer: Self-pay

## 2017-06-25 ENCOUNTER — Emergency Department (HOSPITAL_COMMUNITY)
Admission: EM | Admit: 2017-06-25 | Discharge: 2017-06-25 | Disposition: A | Discharge status: Home / Self Care | Payer: Self-pay | Attending: Emergency Medicine | Admitting: Emergency Medicine

## 2017-06-25 ENCOUNTER — Other Ambulatory Visit: Payer: Self-pay

## 2017-06-25 DIAGNOSIS — Z79899 Other long term (current) drug therapy: Secondary | ICD-10-CM | POA: Insufficient documentation

## 2017-06-25 DIAGNOSIS — Y929 Unspecified place or not applicable: Secondary | ICD-10-CM | POA: Insufficient documentation

## 2017-06-25 DIAGNOSIS — S1096XA Insect bite of unspecified part of neck, initial encounter: Secondary | ICD-10-CM | POA: Insufficient documentation

## 2017-06-25 DIAGNOSIS — Z87891 Personal history of nicotine dependence: Secondary | ICD-10-CM | POA: Insufficient documentation

## 2017-06-25 DIAGNOSIS — L309 Dermatitis, unspecified: Secondary | ICD-10-CM | POA: Insufficient documentation

## 2017-06-25 DIAGNOSIS — W57XXXA Bitten or stung by nonvenomous insect and other nonvenomous arthropods, initial encounter: Secondary | ICD-10-CM | POA: Insufficient documentation

## 2017-06-25 DIAGNOSIS — Y9389 Activity, other specified: Secondary | ICD-10-CM | POA: Insufficient documentation

## 2017-06-25 DIAGNOSIS — Y998 Other external cause status: Secondary | ICD-10-CM | POA: Insufficient documentation

## 2017-06-25 MED ORDER — TRIAMCINOLONE ACETONIDE 0.1 % EX CREA: 1.0000 "application " | TOPICAL_CREAM | Freq: Two times a day (BID) | CUTANEOUS | 0 refills | Status: DC

## 2017-06-25 MED ORDER — CETIRIZINE HCL 10 MG PO TABS: 10.0000 mg | ORAL_TABLET | Freq: Every day | ORAL | 0 refills | Status: DC

## 2017-06-25 NOTE — ED Notes (Signed)
Pt verbalized understanding discharge instructions and denies any further needs or questions at this time. VS stable, ambulatory and steady gait.   

## 2017-06-25 NOTE — ED Triage Notes (Signed)
Pt reports that the past several days she has woke up with several bite marks on her body, around neck, arms, itchy and red

## 2017-06-25 NOTE — Discharge Instructions (Signed)
Use cetirizine daily until symptoms improve. Use kenalog cream as needed. Do not use on the face or genitals.  Wash all your clothes and sheets. You may need to consider changing mattresses/pillows.  Try not to scratch, this will make the itching worse and will be an opportunity for infection.  Follow up as needed.  Return if you develop high fevers, skin is peeling off (especially around your lips), or with any new or concerning symptoms.

## 2017-06-25 NOTE — ED Provider Notes (Signed)
[facility] Provider Note   CSN: [id number] Arrival date & time: 06/25/17  1825     History   Chief Complaint Chief Complaint  Patient presents with  . Pruritis    HPI Leah Cruz is a 37 y.o. female presenting for evaluation of itching.  Patient states over the past week, she has noted increasing number of itchy bites.  They are appearing at night and when she first wakes up in the morning.  Heat makes them worse, nothing makes them better.  She has not tried anything.  She reports her son had 2 spots that resolved with calamine and no further spots.  She denies new environments or locations.  She denies any shampoos, conditioners, detergents, soaps.  She takes acyclovir daily, has been doing so for years.  Bites are around her neck and on her arms.  No bites on her torso.  She denies fevers, chills, nausea, vomiting, rash on her palms or soles, skin sloughing, or blisters.  HPI  Past Medical History:  Diagnosis Date  . HSV-2 infection     Patient Active Problem List   Diagnosis Date Noted  . Severe dysplasia of cervix (CIN III) 11/07/2016  . LGSIL on Pap smear of cervix 09/11/2016  . Herpes genitalis in women 11/24/2013    Past Surgical History:  Procedure Laterality Date  . BREAST BIOPSY    . WISDOM TOOTH EXTRACTION       OB History    Gravida  3   Para  2   Term  2   Preterm      AB  1   Living  2     SAB      TAB  1   Ectopic      Multiple      Live Births  2            Home Medications    Prior to Admission medications   Medication Sig Start Date End Date Taking? Authorizing Provider  acyclovir (ZOVIRAX) 400 MG tablet Take 1 tablet (400 mg total) by mouth 2 (two) times daily. 02/18/17   Shelly Bombard, MD  aspirin-acetaminophen-caffeine (EXCEDRIN MIGRAINE) [phone] MG per tablet Take 2 tablets by mouth every 6 (six) hours as needed for headache. Reported on 02/27/2015    [provider]  cetirizine (ZYRTEC) 10 MG tablet Take 1 tablet (10 mg total) by mouth daily. 06/25/17   Cathlene Gardella, PA-C  medroxyPROGESTERone Acetate 150 MG/ML SUSY USE AS DIRECTED 03/20/17   Shelly Bombard, MD  triamcinolone cream (KENALOG) 0.1 % Apply 1 application topically 2 (two) times daily. 06/25/17   Kellin Fifer, PA-C    Family History No family history on file.  Social History Social History   Tobacco Use  . Smoking status: Former Smoker    Packs/day: 0.50    Years: 15.00    Pack years: 7.50  . Smokeless tobacco: Never Used  Substance Use Topics  . Alcohol use: No    Alcohol/week: 0.0 oz  . Drug use: No     Allergies   Patient has no known allergies.   Review of Systems Review of Systems  Constitutional: Negative for fever.  Skin: Positive for rash.     Physical Exam Updated Vital Signs BP (!) 135/92   Pulse 75   Temp 98.8 F (37.1 C) (Oral)   Resp 20   SpO2 100%   Physical Exam  Constitutional: She is oriented to  person, place, and time. She appears well-developed and well-nourished. No distress.  In no distress  HENT:  Head: Normocephalic and atraumatic.  Eyes: EOM are normal.  Neck: Normal range of motion.  Cardiovascular: Normal rate, regular rhythm and intact distal pulses.  Pulmonary/Chest: Effort normal and breath sounds normal.  Abdominal: She exhibits no distension.  Musculoskeletal: Normal range of motion.  Neurological: She is alert and oriented to person, place, and time.  Skin: Skin is warm. Rash noted.  Discrete bites consistent with insect bites along the back of the neck and bilateral arms.  No bulla, blisters, or skin sloughing.  No rash on the hands or feet.  No drainage.  Psychiatric: She has a normal mood and affect.  Nursing note and vitals reviewed.    ED Treatments / Results  Labs (all labs ordered are listed, but only abnormal results are displayed) Labs Reviewed - No data to display  EKG None  Radiology No  results found.  Procedures Procedures (including critical care time)  Medications Ordered in ED Medications - No data to display   Initial Impression / Assessment and Plan / ED Course  I have reviewed the triage vital signs and the nursing notes.  Pertinent labs & imaging results that were available during my care of the patient were reviewed by me and considered in my medical decision making (see chart for details).     Patient presenting with itchy bumps that are consistent with insect bites.  Discussed with patient.  Discussed symptomatic treatment, washing clothes/sheets, and considering possibility of bedbugs.  Discussed warning signs including signs of RMSF, SJS, TEN.  At this time, patient appears safe for discharge.  Return precautions given.  Patient states she understands and agrees plan.   Final Clinical Impressions(s) / ED Diagnoses   Final diagnoses:  Dermatitis  Bug bite, initial encounter    ED Discharge Orders        Ordered    cetirizine (ZYRTEC) 10 MG tablet  Daily     06/25/17 2026    triamcinolone cream (KENALOG) 0.1 %  2 times daily     06/25/17 2026       Subrina Vecchiarelli, PA-C 06/25/17 2040    Deno Etienne, DO 06/25/17 2046

## 2017-09-01 ENCOUNTER — Ambulatory Visit (INDEPENDENT_AMBULATORY_CARE_PROVIDER_SITE_OTHER): Payer: Self-pay

## 2017-09-01 VITALS — BP 126/79 | HR 70 | Wt 143.2 lb

## 2017-09-01 DIAGNOSIS — Z3042 Encounter for surveillance of injectable contraceptive: Secondary | ICD-10-CM

## 2017-09-01 MED ORDER — MEDROXYPROGESTERONE ACETATE 150 MG/ML IM SUSP
150.0000 mg | Freq: Once | INTRAMUSCULAR | Status: AC
  Administered 2017-09-01: 150 mg via INTRAMUSCULAR

## 2017-09-01 NOTE — Progress Notes (Signed)
Presents for DEPO, given in [place], tolerated well.   Next DEPO Oct. 28- Nov. 11/2017  Administrations This Visit    medroxyPROGESTERone (DEPO-PROVERA) injection 150 mg    Admin Date 09/01/2017 Action Given Dose 150 mg Route Intramuscular Administered By Tamela Oddi, RMA

## 2017-11-24 ENCOUNTER — Ambulatory Visit (INDEPENDENT_AMBULATORY_CARE_PROVIDER_SITE_OTHER): Payer: Self-pay

## 2017-11-24 VITALS — BP 117/76 | HR 90 | Wt 145.6 lb

## 2017-11-24 DIAGNOSIS — Z3042 Encounter for surveillance of injectable contraceptive: Secondary | ICD-10-CM

## 2017-11-24 MED ORDER — MEDROXYPROGESTERONE ACETATE 150 MG/ML IM SUSP
150.0000 mg | Freq: Once | INTRAMUSCULAR | Status: AC
  Administered 2017-11-24: 150 mg via INTRAMUSCULAR

## 2017-11-24 NOTE — Progress Notes (Signed)
Presents for DEPO, given in [place], tolerated well.   Next DEPO Jan. 20- Feb. 03/2018.  Administrations This Visit    medroxyPROGESTERone (DEPO-PROVERA) injection 150 mg    Admin Date 11/24/2017 Action Given Dose 150 mg Route Intramuscular Administered By Tamela Oddi, RMA

## 2018-02-09 ENCOUNTER — Other Ambulatory Visit: Payer: Self-pay | Admitting: Obstetrics

## 2018-02-10 ENCOUNTER — Ambulatory Visit (INDEPENDENT_AMBULATORY_CARE_PROVIDER_SITE_OTHER): Payer: Self-pay

## 2018-02-10 ENCOUNTER — Other Ambulatory Visit: Payer: Self-pay

## 2018-02-10 DIAGNOSIS — Z3042 Encounter for surveillance of injectable contraceptive: Secondary | ICD-10-CM

## 2018-02-10 MED ORDER — MEDROXYPROGESTERONE ACETATE 150 MG/ML IM SUSP
150.0000 mg | Freq: Once | INTRAMUSCULAR | Status: AC
  Administered 2018-02-10: 150 mg via INTRAMUSCULAR

## 2018-02-10 MED ORDER — MEDROXYPROGESTERONE ACETATE 150 MG/ML IM SUSP: 150.0000 mg | INTRAMUSCULAR | 0 refills | Status: DC

## 2018-02-10 NOTE — Progress Notes (Signed)
Nurse visit for pt supply Depo. Pt is on time for injection. Depo given R Del w/o difficulty. Pt needs an annual; she will schedule at checkout today.

## 2018-02-10 NOTE — Progress Notes (Signed)
I have reviewed the chart and agree with nursing staff's documentation of this patient's encounter.  Verita Schneiders, MD 02/10/2018 3:14 PM

## 2018-02-24 ENCOUNTER — Other Ambulatory Visit: Payer: Self-pay | Admitting: Obstetrics

## 2018-02-24 DIAGNOSIS — A6009 Herpesviral infection of other urogenital tract: Secondary | ICD-10-CM

## 2018-02-24 MED ORDER — ACYCLOVIR 400 MG PO TABS: 400.0000 mg | ORAL_TABLET | Freq: Two times a day (BID) | ORAL | 11 refills | Status: DC

## 2018-02-25 ENCOUNTER — Ambulatory Visit: Payer: Self-pay | Admitting: Obstetrics

## 2018-03-09 NOTE — Telephone Encounter (Signed)
error 

## 2018-05-04 ENCOUNTER — Other Ambulatory Visit: Payer: Self-pay | Admitting: Obstetrics

## 2018-05-04 DIAGNOSIS — Z3042 Encounter for surveillance of injectable contraceptive: Secondary | ICD-10-CM

## 2018-05-06 ENCOUNTER — Ambulatory Visit (INDEPENDENT_AMBULATORY_CARE_PROVIDER_SITE_OTHER): Payer: Self-pay

## 2018-05-06 ENCOUNTER — Other Ambulatory Visit: Payer: Self-pay

## 2018-05-06 DIAGNOSIS — Z3042 Encounter for surveillance of injectable contraceptive: Secondary | ICD-10-CM

## 2018-05-06 MED ORDER — MEDROXYPROGESTERONE ACETATE 150 MG/ML IM SUSP
150.0000 mg | Freq: Once | INTRAMUSCULAR | Status: AC
  Administered 2018-05-06: 15:00:00 150 mg via INTRAMUSCULAR

## 2018-05-06 NOTE — Progress Notes (Signed)
Nurse visit for pt's supply Depo. Pt is within her window.  Depo given L Del w/o difficulty. Next inj due 7/1-7/15, pt agrees.

## 2018-05-11 NOTE — Progress Notes (Signed)
I have reviewed the chart and agree with nursing staff's documentation of this patient's encounter.  Aletha Halim, MD 05/11/2018 12:33 PM

## 2018-08-04 ENCOUNTER — Other Ambulatory Visit: Payer: Self-pay | Admitting: Obstetrics

## 2018-08-04 DIAGNOSIS — Z3042 Encounter for surveillance of injectable contraceptive: Secondary | ICD-10-CM

## 2018-08-05 ENCOUNTER — Other Ambulatory Visit: Payer: Self-pay

## 2018-08-05 ENCOUNTER — Ambulatory Visit (INDEPENDENT_AMBULATORY_CARE_PROVIDER_SITE_OTHER): Payer: Self-pay

## 2018-08-05 VITALS — BP 116/83 | HR 73 | Wt 136.0 lb

## 2018-08-05 DIAGNOSIS — Z3042 Encounter for surveillance of injectable contraceptive: Secondary | ICD-10-CM

## 2018-08-05 MED ORDER — MEDROXYPROGESTERONE ACETATE 150 MG/ML IM SUSP: 150.0000 mg | INTRAMUSCULAR | 0 refills | Status: DC

## 2018-08-05 MED ORDER — MEDROXYPROGESTERONE ACETATE 150 MG/ML IM SUSP
150.0000 mg | Freq: Once | INTRAMUSCULAR | Status: AC
  Administered 2018-08-05: 150 mg via INTRAMUSCULAR

## 2018-08-05 NOTE — Telephone Encounter (Signed)
  RX for DEPO sent to Pharmacy.  Patient needs Annual Exam.

## 2018-08-05 NOTE — Progress Notes (Signed)
Presented for DEPO, Injection given in [place], tolerated well.  Next DEPO Sept. 30 - Oct. 14/2020  Administrations This Visit    medroxyPROGESTERone (DEPO-PROVERA) injection 150 mg    Admin Date 08/05/2018 Action Given Dose 150 mg Route Intramuscular Administered By Tamela Oddi, RMA         Needs Annual Exam before next DEPO.

## 2018-10-28 ENCOUNTER — Ambulatory Visit: Payer: Self-pay

## 2018-10-28 ENCOUNTER — Telehealth: Payer: Self-pay | Admitting: General Practice

## 2018-10-28 ENCOUNTER — Other Ambulatory Visit: Payer: Self-pay

## 2018-10-28 DIAGNOSIS — Z3042 Encounter for surveillance of injectable contraceptive: Secondary | ICD-10-CM

## 2018-10-28 MED ORDER — MEDROXYPROGESTERONE ACETATE 150 MG/ML IM SUSP
150.0000 mg | Freq: Once | INTRAMUSCULAR | Status: AC
  Administered 2018-10-28: 15:00:00 150 mg via INTRAMUSCULAR

## 2018-10-28 NOTE — Telephone Encounter (Signed)
Financial Application given to patient.

## 2018-10-28 NOTE — Progress Notes (Signed)
Pt is here for depo injection. Pt is on time and within her window. Injection given in [place] without difficulty. Next Depo injection due 12/23-01/06/21. Last annual exam was 2018, pt made aware that she will get no further refills on depo until she has annual gyn exam in office. Pt verbalizes understanding.

## 2019-01-11 ENCOUNTER — Other Ambulatory Visit: Payer: Self-pay

## 2019-01-11 MED ORDER — MEDROXYPROGESTERONE ACETATE 150 MG/ML IM SUSP: 150.0000 mg | INTRAMUSCULAR | 0 refills | Status: DC

## 2019-01-13 ENCOUNTER — Ambulatory Visit (INDEPENDENT_AMBULATORY_CARE_PROVIDER_SITE_OTHER): Payer: Self-pay

## 2019-01-13 ENCOUNTER — Other Ambulatory Visit: Payer: Self-pay

## 2019-01-13 DIAGNOSIS — Z3042 Encounter for surveillance of injectable contraceptive: Secondary | ICD-10-CM

## 2019-01-13 MED ORDER — MEDROXYPROGESTERONE ACETATE 150 MG/ML IM SUSP
150.0000 mg | INTRAMUSCULAR | Status: AC
  Administered 2019-01-13 – 2023-01-10 (×5): 150 mg via INTRAMUSCULAR

## 2019-01-13 NOTE — Progress Notes (Signed)
I have reviewed this chart and agree with the RN/CMA assessment and management.    K. Meryl [place] Stell, M.D. Attending [facility] (Faculty Practice)   

## 2019-01-13 NOTE — Progress Notes (Signed)
Pt is in the office for depo injection, administered and pt tolerated well. Next due Mar 10-24 . Administrations This Visit    medroxyPROGESTERone (DEPO-PROVERA) injection 150 mg    Admin Date 01/13/2019 Action Given Dose 150 mg Route Intramuscular Administered By Hinton Lovely, RN

## 2019-04-06 ENCOUNTER — Other Ambulatory Visit: Payer: Self-pay

## 2019-04-06 DIAGNOSIS — Z3042 Encounter for surveillance of injectable contraceptive: Secondary | ICD-10-CM

## 2019-04-06 MED ORDER — MEDROXYPROGESTERONE ACETATE 150 MG/ML IM SUSP: 150.0000 mg | INTRAMUSCULAR | 0 refills | Status: DC

## 2019-04-06 NOTE — Progress Notes (Signed)
One refill of Depo sent for pt, she currently does not have insurance and is attempting to have her annual exam through the [place] program. Pt notified no further refills until she has an annual exam with pap smear. Pt voices understanding.

## 2019-04-08 ENCOUNTER — Ambulatory Visit (INDEPENDENT_AMBULATORY_CARE_PROVIDER_SITE_OTHER): Payer: Self-pay

## 2019-04-08 ENCOUNTER — Other Ambulatory Visit: Payer: Self-pay

## 2019-04-08 DIAGNOSIS — Z3042 Encounter for surveillance of injectable contraceptive: Secondary | ICD-10-CM

## 2019-04-08 MED ORDER — MEDROXYPROGESTERONE ACETATE 150 MG/ML IM SUSP
150.0000 mg | Freq: Once | INTRAMUSCULAR | Status: AC
  Administered 2020-07-25: 150 mg via INTRAMUSCULAR

## 2019-04-08 NOTE — Progress Notes (Signed)
Patient presents for depo injection. Patient is within her window. Inj given in [place]. Pt tolerated well. Next depo due 6/3-6/18.

## 2019-04-09 ENCOUNTER — Ambulatory Visit: Payer: Self-pay | Attending: Internal Medicine

## 2019-04-09 DIAGNOSIS — Z23 Encounter for immunization: Secondary | ICD-10-CM

## 2019-04-09 NOTE — Progress Notes (Signed)
   Covid-19 Vaccination Clinic  Name:  Leah Cruz    MRN: [id number] DOB: 01/23/1980  04/09/2019  [name] was observed post Covid-19 immunization for 15 minutes without incident. She was provided with Vaccine Information Sheet and instruction to access the V-Safe system.   [name] was instructed to call 911 with any severe reactions post vaccine: Marland Kitchen Difficulty breathing  . Swelling of face and throat  . A fast heartbeat  . A bad rash all over body  . Dizziness and weakness   Immunizations Administered    Name Date Dose VIS Date Route   Pfizer COVID-19 Vaccine 04/09/2019  3:16 PM 0.3 mL 01/01/2019 Intramuscular   Manufacturer: [place]   Lot: [id number]   [place]: [id number]

## 2019-05-01 ENCOUNTER — Other Ambulatory Visit: Payer: Self-pay

## 2019-05-01 ENCOUNTER — Emergency Department (HOSPITAL_COMMUNITY)
Admission: EM | Admit: 2019-05-01 | Discharge: 2019-05-01 | Disposition: A | Discharge status: Home / Self Care | Payer: HRSA Program | Attending: Emergency Medicine | Admitting: Emergency Medicine

## 2019-05-01 DIAGNOSIS — Z79899 Other long term (current) drug therapy: Secondary | ICD-10-CM | POA: Insufficient documentation

## 2019-05-01 DIAGNOSIS — Z87891 Personal history of nicotine dependence: Secondary | ICD-10-CM | POA: Diagnosis not present

## 2019-05-01 DIAGNOSIS — R509 Fever, unspecified: Secondary | ICD-10-CM | POA: Diagnosis present

## 2019-05-01 DIAGNOSIS — U071 COVID-19: Secondary | ICD-10-CM | POA: Diagnosis not present

## 2019-05-01 DIAGNOSIS — Z20822 Contact with and (suspected) exposure to covid-19: Secondary | ICD-10-CM

## 2019-05-01 LAB — SARS CORONAVIRUS 2 (TAT 6-24 HRS): SARS Coronavirus 2: POSITIVE — AB

## 2019-05-01 NOTE — Discharge Instructions (Signed)
You may take Tylenol and ibuprofen as needed for fever.  Make sure to rest and drink plenty of fluids.  If you develop any severe abdominal pain, persistent diarrhea, coughing up green phlegm, burning with urination please seek reevaluation.  Your Covid test will take approximately 6 to 24 hours to return.  You will be called if the results are positive.

## 2019-05-01 NOTE — ED Triage Notes (Signed)
Pt arrives POV with complaints of waking up this am and not feeling well. Pt checked temp and it was 102.0. Pt stated she took tylenol without relief. Pt endorses diahrrea X2 days. Denies N/V

## 2019-05-01 NOTE — ED Provider Notes (Signed)
[facility] EMERGENCY DEPARTMENT Provider Note   CSN: [id number] Arrival date & time: 05/01/19  1023     History Chief Complaint  Patient presents with  . Fever    Leah Cruz is a 39 y.o. female with notes and past medical history who presents for evaluation of fever.  Patient states she woke up this morning's had some generalized body aches and pains.  Patient with nonproductive cough, 2 episodes of diarrhea over the last 48 hours without melena or bright red blood per rectum.  No nausea or vomiting.  She is able to tolerate p.o. intake at home without difficulty.  Has had some mild rhinorrhea without congestion.  Mild headache without any neck pain, vision changes or weakness.  Took her temperature at home today which is 100.2.  She took Tylenol for this.  Denies lightheadedness, dizziness, sudden onset thunderclap headache, neck pain, neck stiffness, sore throat, drooling, dysphagia, trismus, chest pain, shortness of breath, hemoptysis, abdominal pain, diarrhea, dysuria, vaginal discharge, pelvic pain, concerns for STDs, rashes or lesions.  Denies additional aggravating or alleviating factors.  Denies chance of pregnancy  Patient states she is only here for COVID test   History obtained from patient and past medical records.  No interpreter is used.  HPI     Past Medical History:  Diagnosis Date  . HSV-2 infection     Patient Active Problem List   Diagnosis Date Noted  . Severe dysplasia of cervix (CIN III) 11/07/2016  . LGSIL on Pap smear of cervix 09/11/2016  . Herpes genitalis in women 11/24/2013    Past Surgical History:  Procedure Laterality Date  . BREAST BIOPSY    . WISDOM TOOTH EXTRACTION       OB History    Gravida  3   Para  2   Term  2   Preterm      AB  1   Living  2     SAB      TAB  1   Ectopic      Multiple      Live Births  2           No family history on file.  Social History   Tobacco Use  .  Smoking status: Former Smoker    Packs/day: 0.50    Years: 15.00    Pack years: 7.50  . Smokeless tobacco: Never Used  Substance Use Topics  . Alcohol use: No    Alcohol/week: 0.0 standard drinks  . Drug use: No    Home Medications Prior to Admission medications   Medication Sig Start Date End Date Taking? Authorizing Provider  acyclovir (ZOVIRAX) 400 MG tablet Take 1 tablet (400 mg total) by mouth 2 (two) times daily. 02/24/18   Shelly Bombard, MD  aspirin-acetaminophen-caffeine (EXCEDRIN MIGRAINE) [phone] MG per tablet Take 2 tablets by mouth every 6 (six) hours as needed for headache. Reported on 02/27/2015    [provider]  cetirizine (ZYRTEC) 10 MG tablet Take 1 tablet (10 mg total) by mouth daily. Patient not taking: Reported on 08/05/2018 06/25/17   Caccavale, Sophia, PA-C  medroxyPROGESTERone (DEPO-PROVERA) 150 MG/ML injection Inject 1 mL (150 mg total) into the muscle every 3 (three) months. 04/06/19   Shelly Bombard, MD  triamcinolone cream (KENALOG) 0.1 % Apply 1 application topically 2 (two) times daily. Patient not taking: Reported on 08/05/2018 06/25/17   Franchot Heidelberg, PA-C    Allergies    Patient has  no known allergies.  Review of Systems   Review of Systems  Constitutional: Positive for appetite change, fatigue and fever.  HENT: Positive for postnasal drip and rhinorrhea.   Respiratory: Positive for cough. Negative for apnea, choking, chest tightness, shortness of breath, wheezing and stridor.   Cardiovascular: Negative.   Gastrointestinal: Positive for diarrhea. Negative for abdominal distention, abdominal pain, anal bleeding, blood in stool, constipation, nausea and vomiting.  Genitourinary: Negative.   Musculoskeletal: Negative.   Skin: Negative.   Neurological: Positive for headaches (Intermittent). Negative for dizziness, tremors, seizures, syncope, facial asymmetry, speech difficulty, weakness, light-headedness and numbness.  All other systems  reviewed and are negative.   Physical Exam Updated Vital Signs BP 108/72 (BP Location: Right Arm)   Pulse 75   Temp 98.6 F (37 C) (Oral)   Resp 14   Ht 5\' 8"  (1.727 m)   Wt 52.2 kg   SpO2 99%   BMI 17.49 kg/m   Physical Exam Vitals and nursing note reviewed.  Constitutional:      General: She is not in acute distress.    Appearance: She is not ill-appearing, toxic-appearing or diaphoretic.  HENT:     Head: Normocephalic and atraumatic.     Jaw: There is normal jaw occlusion.     Right Ear: Tympanic membrane, ear canal and external ear normal. There is no impacted cerumen. No hemotympanum. Tympanic membrane is not injected, scarred, perforated, erythematous, retracted or bulging.     Left Ear: Tympanic membrane, ear canal and external ear normal. There is no impacted cerumen. No hemotympanum. Tympanic membrane is not injected, scarred, perforated, erythematous, retracted or bulging.     Ears:     Comments: No Mastoid tenderness.    Nose:     Comments: Clear rhinorrhea and congestion to bilateral nares.  No sinus tenderness.    Mouth/Throat:     Comments: Posterior oropharynx clear.  Mucous membranes moist.  Tonsils without erythema or exudate.  Uvula midline without deviation.  No evidence of PTA or RPA.  No drooling, dysphasia or trismus.  Phonation normal. Neck:     Trachea: Trachea and phonation normal.     Meningeal: Brudzinski's sign and Kernig's sign absent.     Comments: No Neck stiffness or neck rigidity.  No meningismus.  No cervical lymphadenopathy. Cardiovascular:     Comments: No murmurs rubs or gallops. Pulmonary:     Comments: Clear to auscultation bilaterally without wheeze, rhonchi or rales.  No accessory muscle usage.  Able speak in full sentences. Abdominal:     Comments: Soft, nontender without rebound or guarding.  No CVA tenderness.  Musculoskeletal:     Comments: Moves all 4 extremities without difficulty.  Lower extremities without edema, erythema or  warmth.  Skin:    Comments: Brisk capillary refill.  No rashes or lesions.  Neurological:     Mental Status: She is alert.     Comments: Ambulatory in department without difficulty.  Cranial nerves II through XII grossly intact.  No facial droop.  No aphasia.     ED Results / Procedures / Treatments   Labs (all labs ordered are listed, but only abnormal results are displayed) Labs Reviewed  SARS CORONAVIRUS 2 (TAT 6-24 HRS)    EKG None  Radiology No results found.  Procedures Procedures (including critical care time)  Medications Ordered in ED Medications - No data to display  ED Course  I have reviewed the triage vital signs and the nursing notes.  Pertinent labs &  imaging results that were available during my care of the patient were reviewed by me and considered in my medical decision making (see chart for details).  39 year old female appears otherwise well presents for evaluation of fever.  He is afebrile here, nonseptic, not ill-appearing.  T-max 102 at home.  Associated nonproductive cough, rhinorrhea, body aches and pains, 2 episodes of diarrhea without melena or bright red blood per rectum.  Patient also with headache which was consistent to her prior headaches.  Denies recent injury, trauma, sudden onset thunderclap headache.  She has a nonfocal neuro exam without deficits.  She has no neck stiffness or neck rigidity.  No meningismus.  Her heart and lungs are clear.  Her abdomen is soft, nontender without rebound or guarding.  She has no dysuria, concerns for STDs or UTI.  No recent antibiotic or travel.  Have low suspicion for acute bacterial infectious process.  Patient states she is only here for Covid test.  Discussed symptomatic management and Covid testing.  Discussed return precautions.  She voiced understanding is agreeable for follow-up.  The patient has been appropriately medically screened and/or stabilized in the ED. I have low suspicion for any other emergent  medical condition which would require further screening, evaluation or treatment in the ED or require inpatient management.  Patient is hemodynamically stable and in no acute distress.  Patient able to ambulate in department prior to ED.  Evaluation does not show acute pathology that would require ongoing or additional emergent interventions while in the emergency department or further inpatient treatment.  I have discussed the diagnosis with the patient and answered all questions.  Pain is been managed while in the emergency department and patient has no further complaints prior to discharge.  Patient is comfortable with plan discussed in room and is stable for discharge at this time.  I have discussed strict return precautions for returning to the emergency department.  Patient was encouraged to follow-up with PCP/specialist refer to at discharge.    MDM Rules/Calculators/A&P                       SHACARA GOLLIHAR was evaluated in Emergency Department on 05/01/2019 for the symptoms described in the history of present illness. She was evaluated in the context of the global COVID-19 pandemic, which necessitated consideration that the patient might be at risk for infection with the SARS-CoV-2 virus that causes COVID-19. Institutional protocols and algorithms that pertain to the evaluation of patients at risk for COVID-19 are in a state of rapid change based on information released by regulatory bodies including the CDC and federal and state organizations. These policies and algorithms were followed during the patient's care in the ED.  Final Clinical Impression(s) / ED Diagnoses Final diagnoses:  [id number] virus test result unknown  Fever, unspecified fever cause    Rx / DC Orders ED Discharge Orders    None       Lymon Kidney A, PA-C 05/01/19 1059    Isla Pence, MD 05/01/19 1412

## 2019-05-02 ENCOUNTER — Telehealth: Payer: Self-pay

## 2019-05-02 NOTE — Telephone Encounter (Signed)
Pt notified of positive COVID-19 test results. Pt verbalized understanding. Pt reports that they are feeling headache, cough, runny nose, low grade fever, body aches.Pt advised to remain in self quarantine until at least 10 days since symptom onset And 3 consecutive days fever free without antipyretics And improvement in respiratory symptoms. Patient advised to utilize over the counter medications to treat symptoms. Pt advised to seek treatment in the ED if respiratory issues/distress develops.Pt advised they should only leave home to seek and medical care and must wear a mask in public. Pt instructed to limit contact with family members or caregivers in the home. Pt advised to practice social distancing and to continue to use good preventative care measures such has frequent hand washing, staying out of crowds and cleaning hard surfaces frequently touched in the home.Pt informed that the health department will likely follow up and may have additional recommendations. Will notify [facility] Department.

## 2019-05-04 ENCOUNTER — Ambulatory Visit: Payer: Self-pay

## 2019-05-12 ENCOUNTER — Ambulatory Visit: Payer: Self-pay | Attending: Internal Medicine

## 2019-05-12 DIAGNOSIS — Z20822 Contact with and (suspected) exposure to covid-19: Secondary | ICD-10-CM

## 2019-05-27 ENCOUNTER — Ambulatory Visit: Payer: Self-pay

## 2019-06-03 ENCOUNTER — Ambulatory Visit: Payer: Self-pay | Attending: Internal Medicine

## 2019-06-03 DIAGNOSIS — Z23 Encounter for immunization: Secondary | ICD-10-CM

## 2019-06-03 NOTE — Progress Notes (Signed)
   Covid-19 Vaccination Clinic  Name:  Leah Cruz    MRN: [id number] DOB: [birth year]  06/03/2019  [name] was observed post Covid-19 immunization for 15 minutes without incident. She was provided with Vaccine Information Sheet and instruction to access the V-Safe system.   [name] was instructed to call 911 with any severe reactions post vaccine: Marland Kitchen Difficulty breathing  . Swelling of face and throat  . A fast heartbeat  . A bad rash all over body  . Dizziness and weakness   Immunizations Administered    Name Date Dose VIS Date Route   Pfizer COVID-19 Vaccine 06/03/2019  4:19 PM 0.3 mL 03/17/2018 Intramuscular   Manufacturer: [place]   Lot: [id number]   [place]: [id number]

## 2019-06-14 ENCOUNTER — Ambulatory Visit: Payer: Self-pay

## 2019-06-28 ENCOUNTER — Ambulatory Visit: Payer: Self-pay | Attending: Internal Medicine

## 2019-06-28 DIAGNOSIS — Z23 Encounter for immunization: Secondary | ICD-10-CM

## 2019-06-28 NOTE — Progress Notes (Signed)
   Covid-19 Vaccination Clinic  Name:  Leah Cruz    MRN: [id number] DOB: 06-14-1980  06/28/2019  [name] was observed post Covid-19 immunization for 15 minutes without incident. She was provided with Vaccine Information Sheet and instruction to access the V-Safe system.   [name] was instructed to call 911 with any severe reactions post vaccine: Marland Kitchen Difficulty breathing  . Swelling of face and throat  . A fast heartbeat  . A bad rash all over body  . Dizziness and weakness   Immunizations Administered    Name Date Dose VIS Date Route   Pfizer COVID-19 Vaccine 06/28/2019  4:09 PM 0.3 mL 03/17/2018 Intramuscular   Manufacturer: [place]   Lot: [id number]   [place]: [id number]

## 2019-06-29 ENCOUNTER — Other Ambulatory Visit: Payer: Self-pay

## 2019-06-29 ENCOUNTER — Ambulatory Visit (INDEPENDENT_AMBULATORY_CARE_PROVIDER_SITE_OTHER): Payer: Self-pay

## 2019-06-29 DIAGNOSIS — Z3042 Encounter for surveillance of injectable contraceptive: Secondary | ICD-10-CM

## 2019-06-29 MED ORDER — MEDROXYPROGESTERONE ACETATE 150 MG/ML IM SUSP
150.0000 mg | Freq: Once | INTRAMUSCULAR | Status: AC
  Administered 2019-06-29: 150 mg via INTRAMUSCULAR

## 2019-06-29 NOTE — Progress Notes (Signed)
Leah Cruz is here for a depo injection. Pt tolerated injection well in the RUOWQ without complication.  Next injection should be between Aug. 25 - Swpt. 8. -EH/RMA

## 2019-09-21 ENCOUNTER — Other Ambulatory Visit: Payer: Self-pay | Admitting: Obstetrics

## 2019-09-21 ENCOUNTER — Other Ambulatory Visit: Payer: Self-pay | Admitting: *Deleted

## 2019-09-21 ENCOUNTER — Other Ambulatory Visit: Payer: Self-pay

## 2019-09-21 ENCOUNTER — Ambulatory Visit (INDEPENDENT_AMBULATORY_CARE_PROVIDER_SITE_OTHER): Payer: Self-pay

## 2019-09-21 DIAGNOSIS — Z3042 Encounter for surveillance of injectable contraceptive: Secondary | ICD-10-CM

## 2019-09-21 MED ORDER — MEDROXYPROGESTERONE ACETATE 150 MG/ML IM SUSP: 150.0000 mg | INTRAMUSCULAR | 0 refills | Status: DC

## 2019-09-21 NOTE — Progress Notes (Signed)
Pt is in the office for depo, administered in [place] and pt tolerated well. Next due Nov 16-30 .Marland Kitchen Administrations This Visit    medroxyPROGESTERone (DEPO-PROVERA) injection 150 mg    Admin Date 09/21/2019 Action Given Dose 150 mg Route Intramuscular Administered By Hinton Lovely, RN

## 2019-09-21 NOTE — Progress Notes (Signed)
Depo refill sent today

## 2019-09-22 NOTE — Progress Notes (Signed)
Patient was assessed and managed by nursing staff during this encounter. I have reviewed the chart and agree with the documentation and plan. I have also made any necessary editorial changes.  Verita Schneiders, MD 09/22/2019 9:38 AM

## 2019-12-09 ENCOUNTER — Other Ambulatory Visit: Payer: Self-pay | Admitting: Obstetrics

## 2019-12-09 DIAGNOSIS — Z3042 Encounter for surveillance of injectable contraceptive: Secondary | ICD-10-CM

## 2019-12-14 ENCOUNTER — Other Ambulatory Visit: Payer: Self-pay

## 2019-12-14 ENCOUNTER — Ambulatory Visit (INDEPENDENT_AMBULATORY_CARE_PROVIDER_SITE_OTHER): Payer: Self-pay

## 2019-12-14 VITALS — BP 112/76 | HR 78

## 2019-12-14 DIAGNOSIS — Z3042 Encounter for surveillance of injectable contraceptive: Secondary | ICD-10-CM

## 2019-12-14 MED ORDER — MEDROXYPROGESTERONE ACETATE 150 MG/ML IM SUSP: 150.0000 mg | INTRAMUSCULAR | 0 refills | Status: DC

## 2019-12-14 NOTE — Progress Notes (Signed)
Patient presented to the office for depo given in right ventrogluteal.  Side Effects: none HCG Serum: N/A Patient to f/up in Feb, 2022

## 2020-03-07 ENCOUNTER — Ambulatory Visit: Payer: Self-pay

## 2020-06-13 ENCOUNTER — Ambulatory Visit (HOSPITAL_COMMUNITY)
Admission: EM | Admit: 2020-06-13 | Discharge: 2020-06-13 | Disposition: A | Discharge status: Home / Self Care | Payer: Self-pay | Attending: Family Medicine | Admitting: Family Medicine

## 2020-06-13 ENCOUNTER — Other Ambulatory Visit: Payer: Self-pay

## 2020-06-13 ENCOUNTER — Encounter (HOSPITAL_COMMUNITY): Payer: Self-pay

## 2020-06-13 DIAGNOSIS — S161XXA Strain of muscle, fascia and tendon at neck level, initial encounter: Secondary | ICD-10-CM

## 2020-06-13 DIAGNOSIS — S39012A Strain of muscle, fascia and tendon of lower back, initial encounter: Secondary | ICD-10-CM

## 2020-06-13 MED ORDER — KETOROLAC TROMETHAMINE 60 MG/2ML IM SOLN
INTRAMUSCULAR | Status: AC
  Filled 2020-06-13: qty 2

## 2020-06-13 MED ORDER — CYCLOBENZAPRINE HCL 10 MG PO TABS: 10.0000 mg | ORAL_TABLET | Freq: Two times a day (BID) | ORAL | 0 refills | Status: DC | PRN

## 2020-06-13 MED ORDER — KETOROLAC TROMETHAMINE 60 MG/2ML IM SOLN
60.0000 mg | Freq: Once | INTRAMUSCULAR | Status: AC
  Administered 2020-06-13: 60 mg via INTRAMUSCULAR

## 2020-06-13 MED ORDER — NAPROXEN 500 MG PO TABS: 500.0000 mg | ORAL_TABLET | Freq: Two times a day (BID) | ORAL | 0 refills | Status: DC | PRN
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
## 2020-06-13 NOTE — Discharge Instructions (Addendum)
Do not start the naproxen until tomorrow or the next day as you are Toradol shot we gave you here today we will cover you until then, may take Tylenol in the meantime for breakthrough pain.
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
## 2020-07-13 ENCOUNTER — Encounter (HOSPITAL_COMMUNITY): Payer: Self-pay

## 2020-07-13 ENCOUNTER — Emergency Department (HOSPITAL_COMMUNITY)
Admission: EM | Admit: 2020-07-13 | Discharge: 2020-07-14 | Disposition: A | Discharge status: Home / Self Care | Payer: Medicaid Other | Attending: Emergency Medicine | Admitting: Emergency Medicine

## 2020-07-13 ENCOUNTER — Other Ambulatory Visit: Payer: Self-pay

## 2020-07-13 DIAGNOSIS — Z87891 Personal history of nicotine dependence: Secondary | ICD-10-CM | POA: Diagnosis not present

## 2020-07-13 DIAGNOSIS — R1084 Generalized abdominal pain: Secondary | ICD-10-CM

## 2020-07-13 DIAGNOSIS — D270 Benign neoplasm of right ovary: Secondary | ICD-10-CM | POA: Insufficient documentation

## 2020-07-13 DIAGNOSIS — R112 Nausea with vomiting, unspecified: Secondary | ICD-10-CM

## 2020-07-13 NOTE — ED Triage Notes (Signed)
Pt BIB EMS from home. Pt states she had a ham sandwich at approx 0800 today, started having abdominal pain afterwards. Pt states pain is 10/10. Pt is on her period and states this is not period cramps.

## 2020-07-14 ENCOUNTER — Emergency Department (HOSPITAL_COMMUNITY): Payer: Medicaid Other

## 2020-07-14 LAB — URINALYSIS, ROUTINE W REFLEX MICROSCOPIC
Bacteria, UA: NONE SEEN
Bilirubin Urine: NEGATIVE
Glucose, UA: NEGATIVE mg/dL
Hgb urine dipstick: NEGATIVE
Ketones, ur: 80 mg/dL — AB
Leukocytes,Ua: NEGATIVE
Nitrite: NEGATIVE
Protein, ur: 30 mg/dL — AB
Specific Gravity, Urine: 1.031 — ABNORMAL HIGH (ref 1.005–1.030)
pH: 5 (ref 5.0–8.0)

## 2020-07-14 LAB — COMPREHENSIVE METABOLIC PANEL
ALT: 22 U/L (ref 0–44)
AST: 32 U/L (ref 15–41)
Albumin: 4.4 g/dL (ref 3.5–5.0)
Alkaline Phosphatase: 50 U/L (ref 38–126)
Anion gap: 8 (ref 5–15)
BUN: 14 mg/dL (ref 6–20)
CO2: 21 mmol/L — ABNORMAL LOW (ref 22–32)
Calcium: 9.4 mg/dL (ref 8.9–10.3)
Chloride: 113 mmol/L — ABNORMAL HIGH (ref 98–111)
Creatinine, Ser: 0.9 mg/dL (ref 0.44–1.00)
GFR, Estimated: >60 mL/min (ref >60)
Glucose, Bld: 132 mg/dL — ABNORMAL HIGH (ref 70–99)
Potassium: 3.1 mmol/L — ABNORMAL LOW (ref 3.5–5.1)
Sodium: 142 mmol/L (ref 135–145)
Total Bilirubin: 0.9 mg/dL (ref 0.3–1.2)
Total Protein: 7.6 g/dL (ref 6.5–8.1)

## 2020-07-14 LAB — CBC
HCT: 41.7 % (ref 36.0–46.0)
Hemoglobin: 13.8 g/dL (ref 12.0–15.0)
MCH: 29.8 pg (ref 26.0–34.0)
MCHC: 33.1 g/dL (ref 30.0–36.0)
MCV: 90.1 fL (ref 80.0–100.0)
Platelets: 228 10*3/uL (ref 150–400)
RBC: 4.63 MIL/uL (ref 3.87–5.11)
RDW: 14.2 % (ref 11.5–15.5)
WBC: 9.8 10*3/uL (ref 4.0–10.5)
nRBC: 0 % (ref 0.0–0.2)

## 2020-07-14 LAB — GC/CHLAMYDIA PROBE AMP (~~LOC~~) NOT AT ARMC
Chlamydia: NEGATIVE
Comment: NEGATIVE
Comment: NORMAL
Neisseria Gonorrhea: NEGATIVE

## 2020-07-14 LAB — CBG MONITORING, ED: Glucose-Capillary: 114 mg/dL — ABNORMAL HIGH (ref 70–99)

## 2020-07-14 LAB — I-STAT BETA HCG BLOOD, ED (MC, WL, AP ONLY): I-stat hCG, quantitative: <5 m[IU]/mL (ref <5)

## 2020-07-14 LAB — WET PREP, GENITAL
Sperm: NONE SEEN
Trich, Wet Prep: NONE SEEN
WBC, Wet Prep HPF POC: NONE SEEN
Yeast Wet Prep HPF POC: NONE SEEN

## 2020-07-14 LAB — LIPASE, BLOOD: Lipase: 36 U/L (ref 11–51)

## 2020-07-14 MED ORDER — KETOROLAC TROMETHAMINE 15 MG/ML IJ SOLN
15.0000 mg | Freq: Once | INTRAMUSCULAR | Status: AC
  Administered 2020-07-14: 15 mg via INTRAVENOUS
  Filled 2020-07-14: qty 1

## 2020-07-14 MED ORDER — POTASSIUM CHLORIDE 10 MEQ/100ML IV SOLN
10.0000 meq | Freq: Once | INTRAVENOUS | Status: AC
  Administered 2020-07-14: 10 meq via INTRAVENOUS
  Filled 2020-07-14: qty 100

## 2020-07-14 MED ORDER — ONDANSETRON 4 MG PO TBDP: 4.0000 mg | ORAL_TABLET | Freq: Three times a day (TID) | ORAL | 0 refills | Status: DC | PRN

## 2020-07-14 NOTE — ED Notes (Signed)
Pt. CBG 114, RN, Lysbeth Galas made aware.

## 2020-07-14 NOTE — Discharge Instructions (Addendum)
Use zofran as directed for nausea. Follow up with your doctor if symptoms persist. Return to the ED with any worsening symptoms - high fever, severe pain, uncontrolled vomiting.   As we discussed, your CT scan shows a DERMOID CYST on your right ovary. These findings should be discussed with your gynecologist during your already scheduled appointment July 5th.

## 2020-07-14 NOTE — ED Provider Notes (Signed)
[facility] DEPT Provider Note   CSN: [id number] Arrival date & time: 07/13/20  2023     History Chief Complaint  Patient presents with   Abdominal Pain    Leah Cruz is a 40 y.o. female.  Patient to ED with complaint of abdominal pain that started in the LLE this morning and has been constant throughout the day. She has had nausea with vomiting. No fever. She states the pain has moved to the periumbilical area now. No chest pain, cough, SOB. No diarrhea. She reports having unusual vaginal discharge, "brown", but has just come off DepoProvera and thought it was her menses. She denies recent intercourse and is not concerned regarding infection. No dysuria or frequency.   The history is provided by the patient. No language interpreter was used.  Abdominal Pain Associated symptoms: nausea and vomiting   Associated symptoms: no chills, no diarrhea, no dysuria and no fever       Past Medical History:  Diagnosis Date   HSV-2 infection     Patient Active Problem List   Diagnosis Date Noted   Severe dysplasia of cervix (CIN III) 11/07/2016   LGSIL on Pap smear of cervix 09/11/2016   Herpes genitalis in women 11/24/2013    Past Surgical History:  Procedure Laterality Date   BREAST BIOPSY     WISDOM TOOTH EXTRACTION       OB History     Gravida  3   Para  2   Term  2   Preterm      AB  1   Living  2      SAB      IAB  1   Ectopic      Multiple      Live Births  2           History reviewed. No pertinent family history.  Social History   Tobacco Use   Smoking status: Former    Packs/day: 0.50    Years: 15.00    Pack years: 7.50    Types: Cigarettes   Smokeless tobacco: Never  Substance Use Topics   Alcohol use: No    Alcohol/week: 0.0 standard drinks   Drug use: No    Home Medications Prior to Admission medications   Medication Sig Start Date End Date Taking? Authorizing Provider  acyclovir (ZOVIRAX)  400 MG tablet Take 1 tablet (400 mg total) by mouth 2 (two) times daily. 02/24/18   Shelly Bombard, MD  aspirin-acetaminophen-caffeine (EXCEDRIN MIGRAINE) [phone] MG per tablet Take 2 tablets by mouth every 6 (six) hours as needed for headache. Reported on 02/27/2015    [provider]  cetirizine (ZYRTEC) 10 MG tablet Take 1 tablet (10 mg total) by mouth daily. Patient not taking: Reported on 08/05/2018 06/25/17   Caccavale, Sophia, PA-C  cyclobenzaprine (FLEXERIL) 10 MG tablet Take 1 tablet (10 mg total) by mouth 2 (two) times daily as needed for muscle spasms. Do not drink alcohol or drive while taking this medication.  May cause drowsiness. 06/13/20   Volney American, PA-C  medroxyPROGESTERone (DEPO-PROVERA) 150 MG/ML injection Inject 1 mL (150 mg total) into the muscle every 3 (three) months. 12/14/19   Woodroe Mode, MD  medroxyPROGESTERone Acetate 150 MG/ML SUSY INJECT 1 ML (150MG  TOTOAL) INTO THE MUSCLE EVERY 3 MONTHS 12/09/19   Shelly Bombard, MD  naproxen (NAPROSYN) 500 MG tablet Take 1 tablet (500 mg total) by mouth 2 (two) times daily as  needed. 06/13/20   Volney American, PA-C  triamcinolone cream (KENALOG) 0.1 % Apply 1 application topically 2 (two) times daily. Patient not taking: Reported on 08/05/2018 06/25/17   Caccavale, Sophia, PA-C    Allergies    Patient has no known allergies.  Review of Systems   Review of Systems  Constitutional:  Negative for chills and fever.  HENT: Negative.    Respiratory: Negative.    Cardiovascular: Negative.   Gastrointestinal:  Positive for abdominal pain, nausea and vomiting. Negative for diarrhea.  Genitourinary:  Positive for menstrual problem. Negative for dysuria, flank pain and frequency.  Musculoskeletal:  Positive for back pain.  Skin: Negative.   Neurological: Negative.    Physical Exam Updated Vital Signs BP 119/79 (BP Location: Left Arm)   Pulse 64   Temp 98.6 F (37 C) (Oral)   Resp 16   SpO2 100%    Physical Exam Vitals and nursing note reviewed.  Constitutional:      Appearance: She is well-developed.  HENT:     Head: Normocephalic.  Cardiovascular:     Rate and Rhythm: Normal rate and regular rhythm.     Heart sounds: No murmur heard. Pulmonary:     Effort: Pulmonary effort is normal.     Breath sounds: Normal breath sounds. No wheezing, rhonchi or rales.  Abdominal:     General: Bowel sounds are decreased. There is no distension.     Palpations: Abdomen is soft.     Tenderness: There is generalized abdominal tenderness. There is guarding. There is no rebound.  Genitourinary:    Vagina: Normal.     Cervix: No cervical motion tenderness, discharge or friability.     Uterus: Not tender.      Adnexa: Right adnexa normal and left adnexa normal.       Right: No tenderness.         Left: No tenderness.       Comments: Thick cervical blood present. Musculoskeletal:        General: Normal range of motion.     Cervical back: Normal range of motion and neck supple.  Skin:    General: Skin is warm and dry.  Neurological:     General: No focal deficit present.     Mental Status: She is alert and oriented to person, place, and time.     Comments: UE tremor present.    ED Results / Procedures / Treatments   Labs (all labs ordered are listed, but only abnormal results are displayed) Labs Reviewed  CBG MONITORING, ED - Abnormal; Notable for the following components:      Result Value   Glucose-Capillary 114 (*)    All other components within normal limits  CBC  LIPASE, BLOOD  COMPREHENSIVE METABOLIC PANEL  URINALYSIS, ROUTINE W REFLEX MICROSCOPIC  I-STAT BETA HCG BLOOD, ED ([facility], [facility], [facility] ONLY)   Results for orders placed or performed during the hospital encounter of 07/13/20  Lipase, blood  Result Value Ref Range   Lipase 36 11 - 51 U/L  Comprehensive metabolic panel  Result Value Ref Range   Sodium 142 135 - 145 mmol/L   Potassium 3.1 (L) 3.5 - 5.1 mmol/L   Chloride  113 (H) 98 - 111 mmol/L   CO2 21 (L) 22 - 32 mmol/L   Glucose, Bld 132 (H) 70 - 99 mg/dL   BUN 14 6 - 20 mg/dL   Creatinine, Ser 0.90 0.44 - 1.00 mg/dL   Calcium 9.4 8.9 -  10.3 mg/dL   Total Protein 7.6 6.5 - 8.1 g/dL   Albumin 4.4 3.5 - 5.0 g/dL   AST 32 15 - 41 U/L   ALT 22 0 - 44 U/L   Alkaline Phosphatase 50 38 - 126 U/L   Total Bilirubin 0.9 0.3 - 1.2 mg/dL   GFR, Estimated >60 >60 mL/min   Anion gap 8 5 - 15  CBC  Result Value Ref Range   WBC 9.8 4.0 - 10.5 K/uL   RBC 4.63 3.87 - 5.11 MIL/uL   Hemoglobin 13.8 12.0 - 15.0 g/dL   HCT 41.7 36.0 - 46.0 %   MCV 90.1 80.0 - 100.0 fL   MCH 29.8 26.0 - 34.0 pg   MCHC 33.1 30.0 - 36.0 g/dL   RDW 14.2 11.5 - 15.5 %   Platelets 228 150 - 400 K/uL   nRBC 0.0 0.0 - 0.2 %  I-Stat beta hCG blood, ED  Result Value Ref Range   I-stat hCG, quantitative <5.0 <5 mIU/mL   Comment 3          CBG monitoring, ED  Result Value Ref Range   Glucose-Capillary 114 (H) 70 - 99 mg/dL   Comment 1 Notify RN     EKG None  Radiology No results found. CT ABDOMEN PELVIS WO CONTRAST  Result Date: 07/14/2020 CLINICAL DATA:  Postprandial abdominal pain EXAM: CT ABDOMEN AND PELVIS WITHOUT CONTRAST TECHNIQUE: Multidetector CT imaging of the abdomen and pelvis was performed following the standard protocol without IV contrast. COMPARISON:  11/22/2010 FINDINGS: Lower chest: The visualized lung bases are clear bilaterally. The visualized heart and pericardium are unremarkable. Hepatobiliary: No focal liver abnormality is seen. No gallstones, gallbladder wall thickening, or biliary dilatation. Pancreas: Unremarkable Spleen: Unremarkable Adrenals/Urinary Tract: Adrenal glands are unremarkable. Kidneys are normal, without renal calculi, focal lesion, or hydronephrosis. Bladder is unremarkable. Stomach/Bowel: Stomach is within normal limits. Appendix appears normal. No evidence of bowel wall thickening, distention, or inflammatory changes. No free intraperitoneal gas  or fluid. Vascular/Lymphatic: No significant vascular findings are present. No enlarged abdominal or pelvic lymph nodes. Reproductive: 15 mm macroscopic fat containing lesion is seen within the right ovary in keeping with a ovarian dermoid. Pelvic organs are otherwise unremarkable. Other: Tiny fat containing umbilical hernia. Musculoskeletal: No acute bone abnormality. No lytic or blastic bone lesion identified. IMPRESSION: No acute intra-abdominal pathology identified. No definite radiographic explanation for the patient's reported symptoms. 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 Electronically Signed   By: Fidela Salisbury MD   On: 07/14/2020 03:04    Procedures Procedures   Medications Ordered in ED Medications - No data to display  ED Course  I have reviewed the triage vital signs and the nursing notes.  Pertinent labs & imaging results that were available during my care of the patient were reviewed by me and considered in my medical decision making (see chart for details).    MDM Rules/Calculators/A&P                          Patient to ED with c/o abdominal pain x 1 day. No diarrhea, fever. She has nausea without vomiting. No history of same.   She is uncomfortable appearing but not ill. She has diffuse abdominal tenderness. Labs significant for low potassium of 3.1. UA remains pending. CT abd/pel unremarkable for abdominal findings. There is a dermoid cyst on right ovary. These findings were relayed to the patient who reports she has no knowledge  of dermoid cyst previously. She has a scheduled appointment with GYN upcoming. She is encouraged to discuss finding of dermoid cyst at that time.   Toradol provided for pain. On re-examination, her pain and tenderness have completely resolved.  UA collected. Discussed her vaginal discharge, brownish. Will pursue pelvic exam.   No tenderness or concerning discharge on pelvic exam. Doubt infection or contributory to abdominal pain from earlier today.    She is tolerating PO fluids - no further nausea. VSS. She is felt appropriate for discharge home.   Final Clinical Impression(s) / ED Diagnoses Final diagnoses:  None   Abdominal pain Nausea, vomiting Right dermoid cyst  Rx / DC Orders ED Discharge Orders     None        Charlann Lange, PA-C 07/14/20 0527    Orpah Greek, MD 07/14/20 [phone]

## 2020-07-25 ENCOUNTER — Encounter: Payer: Self-pay | Admitting: Obstetrics

## 2020-07-25 ENCOUNTER — Other Ambulatory Visit: Payer: Self-pay

## 2020-07-25 ENCOUNTER — Other Ambulatory Visit (HOSPITAL_COMMUNITY)
Admission: RE | Admit: 2020-07-25 | Discharge: 2020-07-25 | Disposition: A | Discharge status: Home / Self Care | Payer: Medicaid Other | Source: Ambulatory Visit | Attending: Obstetrics | Admitting: Obstetrics

## 2020-07-25 ENCOUNTER — Ambulatory Visit (INDEPENDENT_AMBULATORY_CARE_PROVIDER_SITE_OTHER): Payer: Self-pay | Admitting: Obstetrics

## 2020-07-25 VITALS — BP 118/82 | HR 63 | Ht 67.0 in | Wt 132.7 lb

## 2020-07-25 DIAGNOSIS — Z01419 Encounter for gynecological examination (general) (routine) without abnormal findings: Secondary | ICD-10-CM | POA: Diagnosis not present

## 2020-07-25 DIAGNOSIS — F17200 Nicotine dependence, unspecified, uncomplicated: Secondary | ICD-10-CM

## 2020-07-25 DIAGNOSIS — Z3042 Encounter for surveillance of injectable contraceptive: Secondary | ICD-10-CM

## 2020-07-25 DIAGNOSIS — A6009 Herpesviral infection of other urogenital tract: Secondary | ICD-10-CM

## 2020-07-25 MED ORDER — ACYCLOVIR 400 MG PO TABS: 400.0000 mg | ORAL_TABLET | Freq: Two times a day (BID) | ORAL | 11 refills | Status: DC

## 2020-07-25 MED ORDER — MEDROXYPROGESTERONE ACETATE 150 MG/ML IM SUSP: 150.0000 mg | INTRAMUSCULAR | 3 refills | Status: DC

## 2020-07-25 NOTE — Progress Notes (Signed)
     Subjective:  Pt in for Depo Provera injection.    Patient in clinic for annual exam. Patient reported she was seen at ED on 07/14/2020 and was told she had a cyst on right ovary. CT scan of abdomen and pelvic completed at that visit. Patient requested to restart Depo Provera today. LMP 07/02/2020, stopped bleeding a couple of days ago (still having light spotting) Last sexual activity was about 6 years ago. STD screen completed 07/14/20 during ED visit, results negative.  Objective: Need for contraception. No unusual complaints.    Assessment: Pt tolerated Depo injection. Depo given left upper outer quadrant.   Plan:  Next injection due around October 10, 2020-Oct. 4, 2022 .    Derl Barrow, RN   Derl Barrow, RN

## 2020-07-25 NOTE — Progress Notes (Signed)
Subjective:        Leah Cruz is a 40 y.o. female here for a routine exam.  Current complaints: None.    Personal health questionnaire:  Is patient [name], have a family history of breast and/or ovarian cancer: no Is there a family history of uterine cancer diagnosed at age < 44, gastrointestinal cancer, urinary tract cancer, family member who is a Field seismologist syndrome-associated carrier: no Is the patient overweight and hypertensive, family history of diabetes, personal history of gestational diabetes, preeclampsia or PCOS: no Is patient over 28, have PCOS,  family history of premature CHD under age 67, diabetes, smoke, have hypertension or peripheral artery disease:  no At any time, has a partner hit, kicked or otherwise hurt or frightened you?: no Over the past 2 weeks, have you felt down, depressed or hopeless?: no Over the past 2 weeks, have you felt little interest or pleasure in doing things?:no   Gynecologic History Patient's last menstrual period was 07/06/2020. Contraception: none Last Pap: 07-22-2016. Results were: LGSIL Last mammogram: n/a. Results were: n/a  Obstetric History OB History  Gravida Para Term Preterm AB Living  3 2 2   1 2   SAB IAB Ectopic Multiple Live Births    1     2    # Outcome Date GA Lbr Len/2nd Weight Sex Delivery Anes PTL Lv  3 Term 02/28/12 [redacted]w[redacted]d 13:33 / 00:45 7 lb 15.5 oz (3.615 kg) M Vag-Spont EPI  LIV     Birth Comments: CAH--Normal GAL--Normal Thyroid-Normal Biotinidase- Normal Hemoglobin--Normal, FA Amino Acid Profile-Normal Acylcarnitine Profile-Normal   2 IAB           1 Term      Vag-Spont EPI  LIV    Past Medical History:  Diagnosis Date   HSV-2 infection     Past Surgical History:  Procedure Laterality Date   BREAST BIOPSY     WISDOM TOOTH EXTRACTION       Current Outpatient Medications:    aspirin-acetaminophen-caffeine (EXCEDRIN MIGRAINE) 250-250-65 MG per tablet, Take 2 tablets by mouth every 6 (six)  hours as needed for headache. Reported on 02/27/2015, Disp: , Rfl:    cyclobenzaprine (FLEXERIL) 10 MG tablet, Take 1 tablet (10 mg total) by mouth 2 (two) times daily as needed for muscle spasms. Do not drink alcohol or drive while taking this medication.  May cause drowsiness., Disp: 15 tablet, Rfl: 0   acyclovir (ZOVIRAX) 400 MG tablet, Take 1 tablet (400 mg total) by mouth 2 (two) times daily., Disp: 60 tablet, Rfl: 11   medroxyPROGESTERone (DEPO-PROVERA) 150 MG/ML injection, Inject 1 mL (150 mg total) into the muscle every 3 (three) months., Disp: 1 mL, Rfl: 3  Current Facility-Administered Medications:    medroxyPROGESTERone (DEPO-PROVERA) injection 150 mg, 150 mg, Intramuscular, Q90 days, Sloan Leiter, MD, 150 mg at 09/21/19 1545 No Known Allergies  Social History   Tobacco Use   Smoking status: Former    Packs/day: 0.50    Years: 15.00    Pack years: 7.50    Types: Cigarettes   Smokeless tobacco: Never  Substance Use Topics   Alcohol use: No    Alcohol/week: 0.0 standard drinks    History reviewed. No pertinent family history.    Review of Systems  Constitutional: negative for fatigue and weight loss Respiratory: negative for cough and wheezing Cardiovascular: negative for chest pain, fatigue and palpitations Gastrointestinal: negative for abdominal pain and change in bowel habits Musculoskeletal:negative for myalgias Neurological: negative  for gait problems and tremors Behavioral/Psych: negative for abusive relationship, depression Endocrine: negative for temperature intolerance    Genitourinary:negative for abnormal menstrual periods, genital lesions, hot flashes, sexual problems and vaginal discharge Integument/breast: negative for breast lump, breast tenderness, nipple discharge and skin lesion(s)   CT ABDOMEN PELVIS WO CONTRAST (Accession [id number]) (Order [id number]) Imaging Date: 07/14/2020 Department: [facility] Released  By/Authorizing: Charlann Lange, PA-C (auto-released)      Exam Status  Status  Final [99]    PACS Intelerad Image Link   Show images for CT ABDOMEN PELVIS WO CONTRAST  Study Result  Narrative & Impression  CLINICAL DATA:  Postprandial abdominal pain   EXAM: CT ABDOMEN AND PELVIS WITHOUT CONTRAST   TECHNIQUE: Multidetector CT imaging of the abdomen and pelvis was performed following the standard protocol without IV contrast.   COMPARISON:  11/22/2010   FINDINGS: Lower chest: The visualized lung bases are clear bilaterally. The visualized heart and pericardium are unremarkable.   Hepatobiliary: No focal liver abnormality is seen. No gallstones, gallbladder wall thickening, or biliary dilatation.   Pancreas: Unremarkable   Spleen: Unremarkable   Adrenals/Urinary Tract: Adrenal glands are unremarkable. Kidneys are normal, without renal calculi, focal lesion, or hydronephrosis. Bladder is unremarkable.   Stomach/Bowel: Stomach is within normal limits. Appendix appears normal. No evidence of bowel wall thickening, distention, or inflammatory changes. No free intraperitoneal gas or fluid.   Vascular/Lymphatic: No significant vascular findings are present. No enlarged abdominal or pelvic lymph nodes.   Reproductive: 15 mm macroscopic fat containing lesion is seen within the right ovary in keeping with a ovarian dermoid. Pelvic organs are otherwise unremarkable.   Other: Tiny fat containing umbilical hernia.   Musculoskeletal: No acute bone abnormality. No lytic or blastic bone lesion identified.   IMPRESSION: No acute intra-abdominal pathology identified. No definite radiographic explanation for the patient's reported symptoms.   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     Electronically Signed   By: Fidela Salisbury MD   On: 07/14/2020 03:04     Resu   Objective:       BP 118/82 (BP Location: Right Arm, Patient Position: Sitting, Cuff Size: Normal)   Pulse 63    Ht 5\' 7"  (1.702 m)   Wt 132 lb 11.2 oz (60.2 kg)   LMP 07/06/2020 Comment: negative beta HCG 07/14/20  BMI 20.78 kg/m  General:   Alert and no distress  Skin:   no rash or abnormalities  Lungs:   clear to auscultation bilaterally  Heart:   regular rate and rhythm, S1, S2 normal, no murmur, click, rub or gallop  Breasts:   normal without suspicious masses, skin or nipple changes or axillary nodes  Abdomen:  normal findings: no organomegaly, soft, non-tender and no hernia  Pelvis:  External genitalia: normal general appearance Urinary system: urethral meatus normal and bladder without fullness, nontender Vaginal: normal without tenderness, induration or masses Cervix: normal appearance Adnexa: normal bimanual exam Uterus: anteverted and non-tender, normal size   Lab Review Urine pregnancy test Labs reviewed yes Radiologic studies reviewed yes  I have spent a total of 20 minutes of face-to-face time, excluding clinical staff time, reviewing notes and preparing to see patient, ordering tests and/or medications, and counseling the patient.   Assessment:    1. Encounter for routine gynecological examination with Papanicolaou smear of cervix Rx: - Cytology - PAP( [place]) - medroxyPROGESTERone (DEPO-PROVERA) 150 MG/ML injection; Inject 1 mL (150 mg total) into the muscle every 3 (three)  months.  Dispense: 1 mL; Refill: 3  2. Herpes genitalis in women Rx: - acyclovir (ZOVIRAX) 400 MG tablet; Take 1 tablet (400 mg total) by mouth 2 (two) times daily.  Dispense: 60 tablet; Refill: 11  3. Family planning, Depo-Provera contraception monitoring/administration Rx:- - medroxyPROGESTERone (DEPO-PROVERA) 150 MG/ML injection; Inject 1 mL (150 mg total) into the muscle every 3 (three) months.  Dispense: 1 mL; Refill: 3  4. Tobacco dependence - cessation with the aid of medication and behavioral modification recommended    Plan:    Education reviewed: calcium supplements, depression  evaluation, low fat, low cholesterol diet, safe sex/STD prevention, self breast exams, smoking cessation, and weight bearing exercise. Contraception: Depo-Provera injections. Follow up in: 1 year.   Meds ordered this encounter  Medications   acyclovir (ZOVIRAX) 400 MG tablet    Sig: Take 1 tablet (400 mg total) by mouth 2 (two) times daily.    Dispense:  60 tablet    Refill:  11   medroxyPROGESTERone (DEPO-PROVERA) 150 MG/ML injection    Sig: Inject 1 mL (150 mg total) into the muscle every 3 (three) months.    Dispense:  1 mL    Refill:  3      Shelly Bombard, MD 07/25/2020 3:08 PM

## 2020-07-26 LAB — CYTOLOGY - PAP
Adequacy: ABSENT
Comment: NEGATIVE
Diagnosis: NEGATIVE
High risk HPV: NEGATIVE

## 2020-08-06 ENCOUNTER — Other Ambulatory Visit: Payer: Self-pay

## 2020-08-06 ENCOUNTER — Ambulatory Visit (HOSPITAL_COMMUNITY)
Admission: EM | Admit: 2020-08-06 | Discharge: 2020-08-06 | Disposition: A | Discharge status: Home / Self Care | Payer: Medicaid Other | Attending: Student | Admitting: Student

## 2020-08-06 ENCOUNTER — Encounter (HOSPITAL_COMMUNITY): Payer: Self-pay | Admitting: *Deleted

## 2020-08-06 DIAGNOSIS — Z3042 Encounter for surveillance of injectable contraceptive: Secondary | ICD-10-CM | POA: Diagnosis present

## 2020-08-06 DIAGNOSIS — N898 Other specified noninflammatory disorders of vagina: Secondary | ICD-10-CM | POA: Insufficient documentation

## 2020-08-06 DIAGNOSIS — Z113 Encounter for screening for infections with a predominantly sexual mode of transmission: Secondary | ICD-10-CM | POA: Insufficient documentation

## 2020-08-06 LAB — POCT URINALYSIS DIPSTICK, ED / UC
Bilirubin Urine: NEGATIVE
Glucose, UA: NEGATIVE mg/dL
Ketones, ur: NEGATIVE mg/dL
Leukocytes,Ua: NEGATIVE
Nitrite: NEGATIVE
Protein, ur: NEGATIVE mg/dL
Specific Gravity, Urine: 1.02 (ref 1.005–1.030)
Urobilinogen, UA: 0.2 mg/dL (ref 0.0–1.0)
pH: 5 (ref 5.0–8.0)

## 2020-08-06 LAB — POC URINE PREG, ED: Preg Test, Ur: NEGATIVE

## 2020-08-06 MED ORDER — DOXYCYCLINE HYCLATE 100 MG PO CAPS: 100.0000 mg | ORAL_CAPSULE | Freq: Two times a day (BID) | ORAL | 0 refills | Status: AC

## 2020-08-06 MED ORDER — CEFTRIAXONE SODIUM 500 MG IJ SOLR
500.0000 mg | Freq: Once | INTRAMUSCULAR | Status: AC
  Administered 2020-08-06: 500 mg via INTRAMUSCULAR

## 2020-08-06 MED ORDER — METRONIDAZOLE 500 MG PO TABS: 500.0000 mg | ORAL_TABLET | Freq: Two times a day (BID) | ORAL | 0 refills | Status: DC

## 2020-08-06 MED ORDER — LIDOCAINE HCL (PF) 1 % IJ SOLN
INTRAMUSCULAR | Status: AC
  Filled 2020-08-06: qty 2

## 2020-08-06 MED ORDER — FLUCONAZOLE 150 MG PO TABS: 150.0000 mg | ORAL_TABLET | Freq: Every day | ORAL | 0 refills | Status: DC

## 2020-08-06 MED ORDER — CEFTRIAXONE SODIUM 500 MG IJ SOLR
INTRAMUSCULAR | Status: AC
  Filled 2020-08-06: qty 500

## 2020-08-06 NOTE — Discharge Instructions (Addendum)
-  We treated for gonorrhea today with a shot of Rocephin. -For possible chlamydia, Doxycycline twice daily for 7 days.  Make sure to wear sunscreen while spending time outside while on this medication as it can increase your chance of sunburn. You can take this medication with food if you have a sensitive stomach. -For bacterial vaginosis / possible trichomonas, start the antibiotic-Flagyl (metronidazole), 2 pills daily for 7 days.  You can take this with food if you have a sensitive stomach.  Avoid alcohol while taking this medication and for 2 days after as this will cause severe nausea and vomiting. -If you develop a yeast infection (itchy thick white discharge)-  start the Diflucan (fluconazole)- Take one pill today (day 1). If you're still having symptoms in 3 days, take the second pill.  -Abstain from intercourse until negative results and symptoms resolve. -Seek additional medical attention if new symptoms like abdominal pain, back pain, fever/chills, worsening of the vaginal symptoms. -You'll get a call in 2-3 days with test results

## 2020-08-06 NOTE — ED Triage Notes (Signed)
Pt reports vag discharge with itching since WED.

## 2020-08-06 NOTE — ED Provider Notes (Signed)
[facility]    CSN: [id number] Arrival date & time: 08/06/20  1124      History   Chief Complaint Chief Complaint  Patient presents with   Vaginal Discharge   Vaginal Itching    HPI Leah Cruz is a 40 y.o. female presenting with vaginal discharge and itching x5 days. Medical history Depo contraception.Describes discharge as thick and brown. She is not on her period. External vaginal itching. States this feels different than yeast. Denies new soaps or products. Last Depo was 10 days ago. Denies hematuria, dysuria, frequency, urgency, back pain, n/v/d/abd pain, fevers/chills, abdnormal vaginal rashes/lesions.    HPI  Past Medical History:  Diagnosis Date   HSV-2 infection     Patient Active Problem List   Diagnosis Date Noted   Severe dysplasia of cervix (CIN III) 11/07/2016   LGSIL on Pap smear of cervix 09/11/2016   Herpes genitalis in women 11/24/2013    Past Surgical History:  Procedure Laterality Date   BREAST BIOPSY     WISDOM TOOTH EXTRACTION      OB History     Gravida  3   Para  2   Term  2   Preterm      AB  1   Living  2      SAB      IAB  1   Ectopic      Multiple      Live Births  2            Home Medications    Prior to Admission medications   Medication Sig Start Date End Date Taking? Authorizing Provider  doxycycline (VIBRAMYCIN) 100 MG capsule Take 1 capsule (100 mg total) by mouth 2 (two) times daily for 7 days. 08/06/20 08/13/20 Yes Hazel Sams, PA-C  fluconazole (DIFLUCAN) 150 MG tablet Take 1 tablet (150 mg total) by mouth daily. -If you develop a yeast infection, start the Diflucan (fluconazole)- Take one pill today (day 1). If you're still having symptoms in 3 days, take the second pill. 08/06/20  Yes Hazel Sams, PA-C  metroNIDAZOLE (FLAGYL) 500 MG tablet Take 1 tablet (500 mg total) by mouth 2 (two) times daily. 08/06/20  Yes Hazel Sams, PA-C  acyclovir (ZOVIRAX) 400 MG tablet Take 1 tablet  (400 mg total) by mouth 2 (two) times daily. 07/25/20   Shelly Bombard, MD  aspirin-acetaminophen-caffeine (EXCEDRIN MIGRAINE) 8010669231 MG per tablet Take 2 tablets by mouth every 6 (six) hours as needed for headache. Reported on 02/27/2015    [provider]  cyclobenzaprine (FLEXERIL) 10 MG tablet Take 1 tablet (10 mg total) by mouth 2 (two) times daily as needed for muscle spasms. Do not drink alcohol or drive while taking this medication.  May cause drowsiness. 06/13/20   Volney American, PA-C  medroxyPROGESTERone (DEPO-PROVERA) 150 MG/ML injection Inject 1 mL (150 mg total) into the muscle every 3 (three) months. 07/25/20   Shelly Bombard, MD    Family History History reviewed. No pertinent family history.  Social History Social History   Tobacco Use   Smoking status: Former    Packs/day: 0.50    Years: 15.00    Pack years: 7.50    Types: Cigarettes   Smokeless tobacco: Never  Vaping Use   Vaping Use: Never used  Substance Use Topics   Alcohol use: No    Alcohol/week: 0.0 standard drinks   Drug use: No     Allergies  Patient has no known allergies.   Review of Systems Review of Systems  Constitutional:  Negative for appetite change, chills, diaphoresis, fever and unexpected weight change.  HENT:  Negative for congestion, ear pain, sinus pressure, sinus pain, sneezing, sore throat and trouble swallowing.   Respiratory:  Negative for cough, chest tightness and shortness of breath.   Cardiovascular:  Negative for chest pain.  Gastrointestinal:  Negative for abdominal distention, abdominal pain, anal bleeding, blood in stool, constipation, diarrhea, nausea, rectal pain and vomiting.  Genitourinary:  Positive for vaginal discharge. Negative for decreased urine volume, difficulty urinating, dysuria, flank pain, frequency, genital sores, hematuria, menstrual problem, pelvic pain, urgency, vaginal bleeding and vaginal pain.  Musculoskeletal:  Negative for back  pain and myalgias.  Neurological:  Negative for dizziness, weakness, light-headedness and headaches.  All other systems reviewed and are negative.   Physical Exam Triage Vital Signs ED Triage Vitals  Enc Vitals Group     BP 08/06/20 1156 122/90     Pulse Rate 08/06/20 1156 70     Resp 08/06/20 1156 16     Temp 08/06/20 1156 98.9 F (37.2 C)     Temp src --      SpO2 08/06/20 1156 100 %     Weight --      Height --      Head Circumference --      Peak Flow --      Pain Score 08/06/20 1157 10     Pain Loc --      Pain Edu? --      Excl. in [place]? --    No data found.  Updated Vital Signs BP 122/90   Pulse 70   Temp 98.9 F (37.2 C)   Resp 16   LMP 07/13/2020 Comment: negative beta HCG 07/14/20  SpO2 100%   Visual Acuity Right Eye Distance:   Left Eye Distance:   Bilateral Distance:    Right Eye Near:   Left Eye Near:    Bilateral Near:     Physical Exam Vitals reviewed.  Constitutional:      General: She is not in acute distress.    Appearance: Normal appearance. She is not ill-appearing.  HENT:     Head: Normocephalic and atraumatic.     Mouth/Throat:     Mouth: Mucous membranes are moist.     Comments: Moist mucous membranes Eyes:     Extraocular Movements: Extraocular movements intact.     Pupils: Pupils are equal, round, and reactive to light.  Cardiovascular:     Rate and Rhythm: Normal rate and regular rhythm.     Heart sounds: Normal heart sounds.  Pulmonary:     Effort: Pulmonary effort is normal.     Breath sounds: Normal breath sounds. No wheezing, rhonchi or rales.  Abdominal:     General: Bowel sounds are normal. There is no distension.     Palpations: Abdomen is soft. There is no mass.     Tenderness: There is no abdominal tenderness. There is no right CVA tenderness, left CVA tenderness, guarding or rebound.  Genitourinary:    Comments: deferred Skin:    General: Skin is warm.     Capillary Refill: Capillary refill takes less than 2  seconds.     Comments: Good skin turgor  Neurological:     General: No focal deficit present.     Mental Status: She is alert and oriented to person, place, and time.  Psychiatric:  Mood and Affect: Mood normal.        Behavior: Behavior normal.     UC Treatments / Results  Labs (all labs ordered are listed, but only abnormal results are displayed) Labs Reviewed  POCT URINALYSIS DIPSTICK, ED / UC - Abnormal; Notable for the following components:      Result Value   Hgb urine dipstick TRACE (*)    All other components within normal limits  POC URINE PREG, ED  CERVICOVAGINAL ANCILLARY ONLY    EKG   Radiology No results found.  Procedures Procedures (including critical care time)  Medications Ordered in UC Medications  cefTRIAXone (ROCEPHIN) injection 500 mg (has no administration in time range)    Initial Impression / Assessment and Plan / UC Course  I have reviewed the triage vital signs and the nursing notes.  Pertinent labs & imaging results that were available during my care of the patient were reviewed by me and considered in my medical decision making (see chart for details).     This patient is a very pleasant 40 y.o. year old female presenting with vaginal discharge. Afebrile, nontachy, no abd pain or CVAT.   UA with trace blood, otherwise wnl. Did not send culture.  Urine pregnancy negative. Has been on Depo for contraception x8 years. New female partner. Self-swab sent for BV, yeast, G/C/Trich. Declines HIV, RPR.  Discussed risks and benefits of treating today versus waiting for swab results.  Patient expresses strong preference for treating for gonorrhea, chlamydia, trichomonas today, so we will manage with Rocephin, Doxy, Flagyl as below.  Also sent Diflucan for yeast prophylaxis. Safe sex precautions.  ED return precautions discussed. Patient verbalizes understanding and agreement.   Final Clinical Impressions(s) / UC Diagnoses   Final diagnoses:   Vaginal discharge  Routine screening for STI (sexually transmitted infection)  Depo-Provera contraceptive status     Discharge Instructions      -We treated for gonorrhea today with a shot of Rocephin. -For possible chlamydia, Doxycycline twice daily for 7 days.  Make sure to wear sunscreen while spending time outside while on this medication as it can increase your chance of sunburn. You can take this medication with food if you have a sensitive stomach. -For bacterial vaginosis / possible trichomonas, start the antibiotic-Flagyl (metronidazole), 2 pills daily for 7 days.  You can take this with food if you have a sensitive stomach.  Avoid alcohol while taking this medication and for 2 days after as this will cause severe nausea and vomiting. -If you develop a yeast infection (itchy thick white discharge)-  start the Diflucan (fluconazole)- Take one pill today (day 1). If you're still having symptoms in 3 days, take the second pill.  -Abstain from intercourse until negative results and symptoms resolve. -Seek additional medical attention if new symptoms like abdominal pain, back pain, fever/chills, worsening of the vaginal symptoms. -You'll get a call in 2-3 days with test results     ED Prescriptions     Medication Sig Dispense Auth. Provider   doxycycline (VIBRAMYCIN) 100 MG capsule Take 1 capsule (100 mg total) by mouth 2 (two) times daily for 7 days. 14 capsule Hazel Sams, PA-C   metroNIDAZOLE (FLAGYL) 500 MG tablet Take 1 tablet (500 mg total) by mouth 2 (two) times daily. 14 tablet Hazel Sams, PA-C   fluconazole (DIFLUCAN) 150 MG tablet Take 1 tablet (150 mg total) by mouth daily. -If you develop a yeast infection, start the Diflucan (fluconazole)- Take one pill  today (day 1). If you're still having symptoms in 3 days, take the second pill. 2 tablet Hazel Sams, PA-C      PDMP not reviewed this encounter.   Hazel Sams, PA-C 08/06/20 1248

## 2020-08-07 LAB — CERVICOVAGINAL ANCILLARY ONLY
Bacterial Vaginitis (gardnerella): POSITIVE — AB
Candida Glabrata: NEGATIVE
Candida Vaginitis: POSITIVE — AB
Chlamydia: NEGATIVE
Comment: NEGATIVE
Comment: NEGATIVE
Comment: NEGATIVE
Comment: NEGATIVE
Comment: NEGATIVE
Comment: NORMAL
Neisseria Gonorrhea: NEGATIVE
Trichomonas: NEGATIVE

## 2020-09-04 ENCOUNTER — Telehealth: Payer: Self-pay | Admitting: Obstetrics

## 2020-09-04 ENCOUNTER — Other Ambulatory Visit: Payer: Self-pay | Admitting: Obstetrics

## 2020-09-04 MED ORDER — METRONIDAZOLE 500 MG PO TABS: 500.0000 mg | ORAL_TABLET | Freq: Two times a day (BID) | ORAL | 0 refills | Status: DC

## 2020-09-04 NOTE — Telephone Encounter (Signed)
Patient called requesting treatment for BV.  Rx routed to pharmacy per protocol

## 2020-10-12 ENCOUNTER — Ambulatory Visit (INDEPENDENT_AMBULATORY_CARE_PROVIDER_SITE_OTHER): Payer: Medicaid Other

## 2020-10-12 ENCOUNTER — Other Ambulatory Visit: Payer: Self-pay

## 2020-10-12 DIAGNOSIS — Z3042 Encounter for surveillance of injectable contraceptive: Secondary | ICD-10-CM | POA: Diagnosis not present

## 2020-10-12 MED ORDER — MEDROXYPROGESTERONE ACETATE 150 MG/ML IM SUSP
150.0000 mg | Freq: Once | INTRAMUSCULAR | Status: AC
  Administered 2020-10-12: 150 mg via INTRAMUSCULAR

## 2020-10-12 NOTE — Progress Notes (Signed)
Patient presents for depo injection. Patient is within her window. Injection given in RUOQ. Patient tolerated well. Next depo due between 12/8-12/22.

## 2020-12-06 ENCOUNTER — Telehealth: Payer: Self-pay

## 2020-12-06 DIAGNOSIS — N898 Other specified noninflammatory disorders of vagina: Secondary | ICD-10-CM

## 2020-12-06 MED ORDER — FLUCONAZOLE 150 MG PO TABS: 150.0000 mg | ORAL_TABLET | Freq: Every day | ORAL | 0 refills | Status: DC

## 2020-12-06 MED ORDER — METRONIDAZOLE 500 MG PO TABS: 500.0000 mg | ORAL_TABLET | Freq: Two times a day (BID) | ORAL | 0 refills | Status: DC

## 2020-12-06 NOTE — Telephone Encounter (Signed)
Pt called c/o vaginal d/c with itching, odor, and slight burning. Requests Rx for diflucan and flagyl as she is prone to BV and yeast infections frequently. States she was treated back in September for the same thing. Will go ahead and send Rx to patient's preferred pharmacy. Advised if symptoms persist will need to come back to office for vaginal swab. Pt agreed and verbalized understanding.

## 2020-12-29 ENCOUNTER — Other Ambulatory Visit: Payer: Self-pay

## 2020-12-29 ENCOUNTER — Ambulatory Visit (INDEPENDENT_AMBULATORY_CARE_PROVIDER_SITE_OTHER): Payer: Medicaid Other | Admitting: *Deleted

## 2020-12-29 ENCOUNTER — Encounter: Payer: Self-pay | Admitting: Obstetrics

## 2020-12-29 DIAGNOSIS — Z3042 Encounter for surveillance of injectable contraceptive: Secondary | ICD-10-CM | POA: Diagnosis not present

## 2020-12-29 MED ORDER — MEDROXYPROGESTERONE ACETATE 150 MG/ML IM SUSP
150.0000 mg | Freq: Once | INTRAMUSCULAR | Status: AC
  Administered 2020-12-29: 150 mg via INTRAMUSCULAR

## 2020-12-29 NOTE — Progress Notes (Signed)
Date last pap: 07/25/20. Last Depo-Provera: 10/12/20. Side Effects if any: NA. Serum HCG indicated? NA. Depo-Provera 150 mg IM given by: Lillia Abed. Lazarus Salines, RNC in [place]. Next appointment due 03/16/21-03/30/21.

## 2021-03-13 ENCOUNTER — Other Ambulatory Visit: Payer: Self-pay | Admitting: *Deleted

## 2021-03-13 ENCOUNTER — Encounter: Payer: Self-pay | Admitting: *Deleted

## 2021-03-13 DIAGNOSIS — N898 Other specified noninflammatory disorders of vagina: Secondary | ICD-10-CM

## 2021-03-13 MED ORDER — METRONIDAZOLE 500 MG PO TABS: 500.0000 mg | ORAL_TABLET | Freq: Two times a day (BID) | ORAL | 0 refills | Status: DC

## 2021-03-13 NOTE — Progress Notes (Signed)
TC from patient requesting RX for BV. Annual 07/2020. RX Flagyl sent per protocol. MyChart message with education and indication for patient to make an appt to be seen if symptoms do not resolve or if symptoms return within 1 month.

## 2021-03-20 ENCOUNTER — Ambulatory Visit (INDEPENDENT_AMBULATORY_CARE_PROVIDER_SITE_OTHER): Payer: Medicaid Other

## 2021-03-20 ENCOUNTER — Other Ambulatory Visit: Payer: Self-pay

## 2021-03-20 DIAGNOSIS — Z3042 Encounter for surveillance of injectable contraceptive: Secondary | ICD-10-CM | POA: Diagnosis not present

## 2021-03-20 MED ORDER — MEDROXYPROGESTERONE ACETATE 150 MG/ML IM SUSP
150.0000 mg | Freq: Once | INTRAMUSCULAR | Status: AC
  Administered 2021-03-20: 150 mg via INTRAMUSCULAR

## 2021-03-20 NOTE — Progress Notes (Cosign Needed)
Patient presents for depo injection. Patient is within her window. Injection given in RUOQ. Patient tolerated well. Next depo 5/16-5/30.

## 2021-05-02 ENCOUNTER — Other Ambulatory Visit: Payer: Self-pay

## 2021-05-02 ENCOUNTER — Ambulatory Visit (HOSPITAL_COMMUNITY)
Admission: EM | Admit: 2021-05-02 | Discharge: 2021-05-02 | Disposition: A | Discharge status: Home / Self Care | Payer: Medicaid Other | Attending: Internal Medicine | Admitting: Internal Medicine

## 2021-05-02 ENCOUNTER — Encounter (HOSPITAL_COMMUNITY): Payer: Self-pay | Admitting: *Deleted

## 2021-05-02 DIAGNOSIS — Z114 Encounter for screening for human immunodeficiency virus [HIV]: Secondary | ICD-10-CM | POA: Diagnosis not present

## 2021-05-02 DIAGNOSIS — N898 Other specified noninflammatory disorders of vagina: Secondary | ICD-10-CM | POA: Diagnosis not present

## 2021-05-02 DIAGNOSIS — Z113 Encounter for screening for infections with a predominantly sexual mode of transmission: Secondary | ICD-10-CM

## 2021-05-02 DIAGNOSIS — R1030 Lower abdominal pain, unspecified: Secondary | ICD-10-CM | POA: Diagnosis not present

## 2021-05-02 LAB — POCT URINALYSIS DIPSTICK, ED / UC
Bilirubin Urine: NEGATIVE
Glucose, UA: NEGATIVE mg/dL
Hgb urine dipstick: NEGATIVE
Ketones, ur: NEGATIVE mg/dL
Leukocytes,Ua: NEGATIVE
Nitrite: NEGATIVE
Protein, ur: NEGATIVE mg/dL
Specific Gravity, Urine: 1.015 (ref 1.005–1.030)
Urobilinogen, UA: 1 mg/dL (ref 0.0–1.0)
pH: 7 (ref 5.0–8.0)

## 2021-05-02 LAB — POC URINE PREG, ED: Preg Test, Ur: NEGATIVE

## 2021-05-02 LAB — RPR: RPR Ser Ql: NONREACTIVE

## 2021-05-02 LAB — HIV ANTIBODY (ROUTINE TESTING W REFLEX): HIV Screen 4th Generation wRfx: NONREACTIVE

## 2021-05-02 MED ORDER — CEFTRIAXONE SODIUM 500 MG IJ SOLR
500.0000 mg | Freq: Once | INTRAMUSCULAR | Status: AC
  Administered 2021-05-02: 500 mg via INTRAMUSCULAR

## 2021-05-02 MED ORDER — CEFTRIAXONE SODIUM 500 MG IJ SOLR
INTRAMUSCULAR | Status: AC
  Filled 2021-05-02: qty 500

## 2021-05-02 MED ORDER — DOXYCYCLINE HYCLATE 100 MG PO CAPS: 100.0000 mg | ORAL_CAPSULE | Freq: Two times a day (BID) | ORAL | 0 refills | Status: AC

## 2021-05-02 MED ORDER — METRONIDAZOLE 500 MG PO TABS: 500.0000 mg | ORAL_TABLET | Freq: Two times a day (BID) | ORAL | 0 refills | Status: AC

## 2021-05-02 NOTE — ED Triage Notes (Signed)
Pt reports ABD pain ,vag discharge,vag odor. Pt reports a new sexual partner . Pt has concerns for STD. Pt takes Depo and is spotting now. Pt reports her ABD pain may be from the spotting. ?

## 2021-05-02 NOTE — ED Provider Notes (Signed)
?[facility] ? ? ? ?CSN: [id number] ?Arrival date & time: 05/02/21  1497 ? ? ?  ? ?History   ?Chief Complaint ?Chief Complaint  ?Patient presents with  ? SEXUALLY TRANSMITTED DISEASE  ? Abdominal Pain  ? Vaginal Discharge  ? Vaginal Itching  ? ? ?HPI ?Leah Cruz is a 41 y.o. female.  ? ?Vaginal Discharge ?Discharge started few days ago ?Discharge appears thicker ?She endorses vaginal odors and pruritis ?Also having some spotting ?Having lower abdominal pain ?She denies dysuria, hematuria, nausea, vomiting, fevers ?She is sexually active with new partner who did not use condoms.   ?Contraception: Depo, reports is UTD.   ?No LMP recorded. Patient has had an injection.  ?Desires HIV/RPR: yes ?  ?  ? ? ?Past Medical History:  ?Diagnosis Date  ? HSV-2 infection   ? ? ?Patient Active Problem List  ? Diagnosis Date Noted  ? Severe dysplasia of cervix (CIN III) 11/07/2016  ? LGSIL on Pap smear of cervix 09/11/2016  ? Herpes genitalis in women 11/24/2013  ? ? ?Past Surgical History:  ?Procedure Laterality Date  ? BREAST BIOPSY    ? WISDOM TOOTH EXTRACTION    ? ? ?OB History   ? ? Gravida  ?3  ? Para  ?2  ? Term  ?2  ? Preterm  ?   ? AB  ?1  ? Living  ?2  ?  ? ? SAB  ?   ? IAB  ?1  ? Ectopic  ?   ? Multiple  ?   ? Live Births  ?2  ?   ?  ?  ? ? ? ?Home Medications   ? ?Prior to Admission medications   ?Medication Sig Start Date End Date Taking? Authorizing Provider  ?doxycycline (VIBRAMYCIN) 100 MG capsule Take 1 capsule (100 mg total) by mouth 2 (two) times daily for 14 days. 05/02/21 05/16/21 Yes Pharrell Ledford, Bernita Raisin, DO  ?metroNIDAZOLE (FLAGYL) 500 MG tablet Take 1 tablet (500 mg total) by mouth 2 (two) times daily for 14 days. 05/02/21 05/16/21 Yes Shontae Rosiles, Bernita Raisin, DO  ?acyclovir (ZOVIRAX) 400 MG tablet Take 1 tablet (400 mg total) by mouth 2 (two) times daily. 07/25/20   Shelly Bombard, MD  ?aspirin-acetaminophen-caffeine [facility] MIGRAINE) [phone] MG per tablet Take 2 tablets by mouth every 6 (six)  hours as needed for headache. Reported on 02/27/2015    [provider]  ?cyclobenzaprine (FLEXERIL) 10 MG tablet Take 1 tablet (10 mg total) by mouth 2 (two) times daily as needed for muscle spasms. Do not drink alcohol or drive while taking this medication.  May cause drowsiness. 06/13/20   Volney American, PA-C  ?fluconazole (DIFLUCAN) 150 MG tablet Take 1 tablet (150 mg total) by mouth daily. -If you develop a yeast infection, start the Diflucan (fluconazole)- Take one pill today (day 1). If you're still having symptoms in 3 days, take the second pill. 12/06/20   Radene Gunning, MD  ?medroxyPROGESTERone (DEPO-PROVERA) 150 MG/ML injection Inject 1 mL (150 mg total) into the muscle every 3 (three) months. 07/25/20   Shelly Bombard, MD  ? ? ?Family History ?History reviewed. No pertinent family history. ? ?Social History ?Social History  ? ?Tobacco Use  ? Smoking status: Former  ?  Packs/day: 0.50  ?  Years: 15.00  ?  Pack years: 7.50  ?  Types: Cigarettes  ? Smokeless tobacco: Never  ?Vaping Use  ? Vaping Use: Never used  ?Substance Use Topics  ?  Alcohol use: No  ?  Alcohol/week: 0.0 standard drinks  ? Drug use: No  ? ? ? ?Allergies   ?Patient has no known allergies. ? ? ?Review of Systems ?Review of Systems  ?All other systems reviewed and are negative. ? ?Per HPI ?Physical Exam ?Triage Vital Signs ?ED Triage Vitals  ?Enc Vitals Group  ?   BP   ?   Pulse   ?   Resp   ?   Temp   ?   Temp src   ?   SpO2   ?   Weight   ?   Height   ?   Head Circumference   ?   Peak Flow   ?   Pain Score   ?   Pain Loc   ?   Pain Edu?   ?   Excl. in [place]?   ? ?No data found. ? ?Updated Vital Signs ?BP 111/72   Pulse 71   Temp 98.7 ?F (37.1 ?C)   Resp 18   SpO2 98%  ? ?Visual Acuity ?Right Eye Distance:   ?Left Eye Distance:   ?Bilateral Distance:   ? ?Right Eye Near:   ?Left Eye Near:    ?Bilateral Near:    ? ?Physical Exam ?Constitutional:   ?   General: She is not in acute distress. ?   Appearance: Normal appearance.  She is not ill-appearing.  ?HENT:  ?   Head: Normocephalic and atraumatic.  ?Eyes:  ?   Conjunctiva/sclera: Conjunctivae normal.  ?Cardiovascular:  ?   Rate and Rhythm: Normal rate.  ?Pulmonary:  ?   Effort: Pulmonary effort is normal. No respiratory distress.  ?Abdominal:  ?   Palpations: Abdomen is soft.  ?   Tenderness: There is abdominal tenderness in the right lower quadrant, suprapubic area and left lower quadrant. There is no right CVA tenderness, left CVA tenderness, guarding or rebound.  ?Musculoskeletal:  ?   Cervical back: Normal range of motion.  ?Skin: ?   General: Skin is warm and dry.  ?Neurological:  ?   Mental Status: She is alert and oriented to person, place, and time.  ?Psychiatric:     ?   Mood and Affect: Mood normal.     ?   Behavior: Behavior normal.  ? ? ? ?UC Treatments / Results  ?Labs ?(all labs ordered are listed, but only abnormal results are displayed) ?Labs Reviewed  ?URINE CULTURE  ?HIV ANTIBODY (ROUTINE TESTING W REFLEX)  ?RPR  ?POC URINE PREG, ED  ?POCT URINALYSIS DIPSTICK, ED / UC  ?CERVICOVAGINAL ANCILLARY ONLY  ? ? ?EKG ? ? ?Radiology ?No results found. ? ?Procedures ?Procedures (including critical care time) ? ?Medications Ordered in UC ?Medications  ?cefTRIAXone (ROCEPHIN) injection 500 mg (has no administration in time range)  ? ? ?Initial Impression / Assessment and Plan / UC Course  ?I have reviewed the triage vital signs and the nursing notes. ? ?Pertinent labs & imaging results that were available during my care of the patient were reviewed by me and considered in my medical decision making (see chart for details). ? ?  ? ?U preg negative, UA negative.  Urine culture sent.  Given patient's possible exposure and symptoms, indication for empiric treatment of PID.  Given IM ceftriaxone here and prescription for 2 weeks of doxycycline and metronidazole.  Will discontinue antibiotics if cultures return negative.  HIV and RPR also obtained at patient request.  Recommend nothing  from intercourse until treatment is completed  and having partner tested.  Return precautions, see AVS. ? ? ?Final Clinical Impressions(s) / UC Diagnoses  ? ?Final diagnoses:  ?Vaginal discharge  ?Lower abdominal pain  ?Screen for sexually transmitted diseases  ?Screening for HIV (human immunodeficiency virus)  ? ? ? ?Discharge Instructions   ? ?  ?Your urine did not show signs of infection.  It was also negative for pregnancy.  We have given you an injection of an antibiotic and there are 2 more antibiotics that you should pick up from the pharmacy.  Complete these antibiotics over the next 14 days.  If your vaginal swab comes back with no infection, someone will call you and you should stop the antibiotics.  The blood test for HIV and syphilis should also come back tomorrow and someone will contact you with those results.  If your pain worsens, especially if you develop vomiting and are unable to take your medicines, or develop fever, you should be seen by medical provider right away. ? ?Do not have sexual intercourse until you are completely treated and your partner has been tested and treated if necessary.  Avoid alcohol while taking metronidazole as it can make you feel sick. ? ? ? ? ?ED Prescriptions   ? ? Medication Sig Dispense Auth. Provider  ? doxycycline (VIBRAMYCIN) 100 MG capsule Take 1 capsule (100 mg total) by mouth 2 (two) times daily for 14 days. 28 capsule Kinneth Fujiwara, Bernita Raisin, DO  ? metroNIDAZOLE (FLAGYL) 500 MG tablet Take 1 tablet (500 mg total) by mouth 2 (two) times daily for 14 days. 28 tablet Mizuki Hoel, Bernita Raisin, DO  ? ?  ? ?PDMP not reviewed this encounter. ?  ?Cleophas Dunker, DO ?05/02/21 1058 ? ?

## 2021-05-02 NOTE — Discharge Instructions (Addendum)
Your urine did not show signs of infection.  It was also negative for pregnancy.  We have given you an injection of an antibiotic and there are 2 more antibiotics that you should pick up from the pharmacy.  Complete these antibiotics over the next 14 days.  If your vaginal swab comes back with no infection, someone will call you and you should stop the antibiotics.  The blood test for HIV and syphilis should also come back tomorrow and someone will contact you with those results.  If your pain worsens, especially if you develop vomiting and are unable to take your medicines, or develop fever, you should be seen by medical provider right away. ? ?Do not have sexual intercourse until you are completely treated and your partner has been tested and treated if necessary.  Avoid alcohol while taking metronidazole as it can make you feel sick. ?

## 2021-05-03 LAB — URINE CULTURE: Culture: NO GROWTH

## 2021-05-04 LAB — CERVICOVAGINAL ANCILLARY ONLY
Bacterial Vaginitis (gardnerella): POSITIVE — AB
Candida Glabrata: NEGATIVE
Candida Vaginitis: NEGATIVE
Chlamydia: NEGATIVE
Comment: NEGATIVE
Comment: NEGATIVE
Comment: NEGATIVE
Comment: NEGATIVE
Comment: NEGATIVE
Comment: NORMAL
Neisseria Gonorrhea: NEGATIVE
Trichomonas: NEGATIVE

## 2021-06-05 ENCOUNTER — Ambulatory Visit (INDEPENDENT_AMBULATORY_CARE_PROVIDER_SITE_OTHER): Payer: Medicaid Other

## 2021-06-05 DIAGNOSIS — Z3042 Encounter for surveillance of injectable contraceptive: Secondary | ICD-10-CM | POA: Diagnosis not present

## 2021-06-05 NOTE — Progress Notes (Signed)
Pt is in the office for depo injection, administered in [place] and pt tolerated well. Next due Aug 1-15. Last annual was July 2022, advised to schedule .Marland Kitchen Administrations This Visit     medroxyPROGESTERone (DEPO-PROVERA) injection 150 mg     Admin Date 06/05/2021 Action Given Dose 150 mg Route Intramuscular Administered By Hinton Lovely, RN

## 2021-07-25 ENCOUNTER — Other Ambulatory Visit (HOSPITAL_COMMUNITY)
Admission: RE | Admit: 2021-07-25 | Discharge: 2021-07-25 | Disposition: A | Discharge status: Home / Self Care | Payer: Medicaid Other | Source: Ambulatory Visit | Attending: Obstetrics | Admitting: Obstetrics

## 2021-07-25 ENCOUNTER — Encounter: Payer: Self-pay | Admitting: Obstetrics and Gynecology

## 2021-07-25 ENCOUNTER — Ambulatory Visit (INDEPENDENT_AMBULATORY_CARE_PROVIDER_SITE_OTHER): Payer: Medicaid Other | Admitting: Obstetrics and Gynecology

## 2021-07-25 VITALS — BP 108/74 | HR 80 | Ht 67.0 in | Wt 149.2 lb

## 2021-07-25 DIAGNOSIS — Z9889 Other specified postprocedural states: Secondary | ICD-10-CM | POA: Insufficient documentation

## 2021-07-25 DIAGNOSIS — Z01419 Encounter for gynecological examination (general) (routine) without abnormal findings: Secondary | ICD-10-CM | POA: Diagnosis not present

## 2021-07-25 DIAGNOSIS — N760 Acute vaginitis: Secondary | ICD-10-CM | POA: Diagnosis not present

## 2021-07-25 DIAGNOSIS — Z1231 Encounter for screening mammogram for malignant neoplasm of breast: Secondary | ICD-10-CM | POA: Diagnosis not present

## 2021-07-25 DIAGNOSIS — Z113 Encounter for screening for infections with a predominantly sexual mode of transmission: Secondary | ICD-10-CM | POA: Insufficient documentation

## 2021-07-25 NOTE — Progress Notes (Signed)
Pt is in the office for annual. Currently on depo next due Aug 1-15 Last pap 07/25/2020 Pt requests to have pap done today Pt reports vaginal discharge, wants std testing today.

## 2021-07-25 NOTE — Progress Notes (Signed)
GYNECOLOGY ANNUAL PREVENTATIVE CARE ENCOUNTER NOTE  History:     Leah Cruz is a 41 y.o. G39P2012 female here for a routine annual gynecologic exam.  Current complaints: none.   Denies abnormal vaginal bleeding, discharge, pelvic pain, problems with intercourse or other gynecologic concerns.    Gynecologic History No LMP recorded. Patient has had an injection. Contraception: Depo-Provera injections Last Pap: 07/25/20. Results were: normal with negative HPV Last mammogram: 1st mammogram will be this year  Obstetric History OB History  Gravida Para Term Preterm AB Living  '3 2 2   1 2  '$ SAB IAB Ectopic Multiple Live Births    1     2    # Outcome Date GA Lbr Len/2nd Weight Sex Delivery Anes PTL Lv  3 Term 02/28/12 [id number] / 00:45 7 lb 15.5 oz (3.615 kg) M Vag-Spont EPI  LIV     Birth Comments: CAH--Normal GAL--Normal Thyroid-Normal Biotinidase- Normal Hemoglobin--Normal, FA Amino Acid Profile-Normal Acylcarnitine Profile-Normal   2 IAB           1 Term      Vag-Spont EPI  LIV    Past Medical History:  Diagnosis Date   HSV-2 infection     Past Surgical History:  Procedure Laterality Date   BREAST BIOPSY     WISDOM TOOTH EXTRACTION      Current Outpatient Medications on File Prior to Visit  Medication Sig Dispense Refill   aspirin-acetaminophen-caffeine (EXCEDRIN MIGRAINE) 250-250-65 MG per tablet Take 2 tablets by mouth every 6 (six) hours as needed for headache. Reported on 02/27/2015     medroxyPROGESTERone (DEPO-PROVERA) 150 MG/ML injection Inject 1 mL (150 mg total) into the muscle every 3 (three) months. 1 mL 3   acyclovir (ZOVIRAX) 400 MG tablet Take 1 tablet (400 mg total) by mouth 2 (two) times daily. (Patient not taking: Reported on 06/05/2021) 60 tablet 11   cyclobenzaprine (FLEXERIL) 10 MG tablet Take 1 tablet (10 mg total) by mouth 2 (two) times daily as needed for muscle spasms. Do not drink alcohol or drive while taking this medication.  May cause  drowsiness. (Patient not taking: Reported on 06/05/2021) 15 tablet 0   fluconazole (DIFLUCAN) 150 MG tablet Take 1 tablet (150 mg total) by mouth daily. -If you develop a yeast infection, start the Diflucan (fluconazole)- Take one pill today (day 1). If you're still having symptoms in 3 days, take the second pill. (Patient not taking: Reported on 06/05/2021) 2 tablet 0   Current Facility-Administered Medications on File Prior to Visit  Medication Dose Route Frequency Provider Last Rate Last Admin   medroxyPROGESTERone (DEPO-PROVERA) injection 150 mg  150 mg Intramuscular Q90 days DSloan Leiter MD   150 mg at 06/05/21 0825    No Known Allergies  Social History:  reports that she has quit smoking. Her smoking use included cigarettes. She has a 7.50 pack-year smoking history. She has never used smokeless tobacco. She reports current alcohol use. She reports that she does not use drugs.  History reviewed. No pertinent family history.  The following portions of the patient's history were reviewed and updated as appropriate: allergies, current medications, past family history, past medical history, past social history, past surgical history and problem list.  Review of Systems Pertinent items noted in HPI and remainder of comprehensive ROS otherwise negative.  Physical Exam:  BP 108/74   Pulse 80   Ht '5\' 7"'$  (1.702 m)   Wt 149 lb 3.2 oz (67.7 kg)  BMI 23.37 kg/m  CONSTITUTIONAL: Well-developed, well-nourished female in no acute distress.  HENT:  Normocephalic, atraumatic, External right and left ear normal. Oropharynx is clear and moist EYES: Conjunctivae and EOM are normal. NECK: Normal range of motion, supple, no masses.  Normal thyroid.  SKIN: Skin is warm and dry. No rash noted. Not diaphoretic. No erythema. No pallor. MUSCULOSKELETAL: Normal range of motion. No tenderness.  No cyanosis, clubbing, or edema.  2+ distal pulses. NEUROLOGIC: Alert and oriented to person, place, and time.  Normal reflexes, muscle tone coordination.  PSYCHIATRIC: Normal mood and affect. Normal behavior. Normal judgment and thought content. CARDIOVASCULAR: Normal heart rate noted, regular rhythm RESPIRATORY: Clear to auscultation bilaterally. Effort and breath sounds normal, no problems with respiration noted. BREASTS: Symmetric in size. No masses, tenderness, skin changes, nipple drainage, or lymphadenopathy bilaterally. Performed in the presence of a chaperone. ABDOMEN: Soft, no distention noted.  No tenderness, rebound or guarding.  PELVIC: Normal appearing external genitalia and urethral meatus; normal appearing vaginal mucosa and cervix.  No abnormal discharge noted. Cervix normal, evidence of LEEP noted. Normal uterine size, no other palpable masses, no uterine or adnexal tenderness.  Performed in the presence of a chaperone.   Assessment and Plan:    1. Encounter for screening mammogram for malignant neoplasm of breast Will schedule screening mammorgram  2. Routine screening for STI (sexually transmitted infection) Per pt request, vaginal swab only  3. Women's annual routine gynecological examination Normal annual exam.  Pt continues depo provera, advise pap smear in 2024 due to hx of CIN 3/ LEEP  Mammogram scheduled Routine preventative health maintenance measures emphasized. Please refer to After Visit Summary for other counseling recommendations.      Lynnda Shields, MD, [place] [facility], [place]

## 2021-07-26 LAB — CERVICOVAGINAL ANCILLARY ONLY
Bacterial Vaginitis (gardnerella): POSITIVE — AB
Candida Glabrata: NEGATIVE
Candida Vaginitis: NEGATIVE
Chlamydia: NEGATIVE
Comment: NEGATIVE
Comment: NEGATIVE
Comment: NEGATIVE
Comment: NEGATIVE
Comment: NEGATIVE
Comment: NORMAL
Neisseria Gonorrhea: NEGATIVE
Trichomonas: NEGATIVE

## 2021-07-27 ENCOUNTER — Other Ambulatory Visit: Payer: Self-pay | Admitting: *Deleted

## 2021-07-27 ENCOUNTER — Ambulatory Visit: Payer: Medicaid Other | Admitting: Obstetrics

## 2021-07-27 MED ORDER — METRONIDAZOLE 500 MG PO TABS: 500.0000 mg | ORAL_TABLET | Freq: Two times a day (BID) | ORAL | 0 refills | Status: DC

## 2021-07-30 ENCOUNTER — Other Ambulatory Visit: Payer: Self-pay | Admitting: Obstetrics

## 2021-07-30 DIAGNOSIS — A6009 Herpesviral infection of other urogenital tract: Secondary | ICD-10-CM

## 2021-08-14 ENCOUNTER — Ambulatory Visit
Admission: RE | Admit: 2021-08-14 | Discharge: 2021-08-14 | Disposition: A | Discharge status: Home / Self Care | Payer: Medicaid Other | Source: Ambulatory Visit | Attending: Obstetrics and Gynecology | Admitting: Obstetrics and Gynecology

## 2021-08-14 DIAGNOSIS — Z1231 Encounter for screening mammogram for malignant neoplasm of breast: Secondary | ICD-10-CM | POA: Diagnosis not present

## 2021-08-16 ENCOUNTER — Other Ambulatory Visit: Payer: Self-pay | Admitting: Obstetrics and Gynecology

## 2021-08-16 DIAGNOSIS — R928 Other abnormal and inconclusive findings on diagnostic imaging of breast: Secondary | ICD-10-CM

## 2021-08-27 ENCOUNTER — Other Ambulatory Visit: Payer: Self-pay | Admitting: Obstetrics

## 2021-08-27 ENCOUNTER — Ambulatory Visit: Payer: Medicaid Other

## 2021-08-27 ENCOUNTER — Other Ambulatory Visit: Payer: Self-pay | Admitting: *Deleted

## 2021-08-27 DIAGNOSIS — Z3042 Encounter for surveillance of injectable contraceptive: Secondary | ICD-10-CM

## 2021-08-27 DIAGNOSIS — Z01419 Encounter for gynecological examination (general) (routine) without abnormal findings: Secondary | ICD-10-CM

## 2021-08-27 NOTE — Progress Notes (Signed)
Depo refilled today.  Pt is current on AEX.

## 2021-08-28 ENCOUNTER — Ambulatory Visit
Admission: RE | Admit: 2021-08-28 | Discharge: 2021-08-28 | Disposition: A | Discharge status: Home / Self Care | Payer: Medicaid Other | Source: Ambulatory Visit | Attending: Obstetrics and Gynecology | Admitting: Obstetrics and Gynecology

## 2021-08-28 DIAGNOSIS — R928 Other abnormal and inconclusive findings on diagnostic imaging of breast: Secondary | ICD-10-CM

## 2021-08-29 ENCOUNTER — Ambulatory Visit (INDEPENDENT_AMBULATORY_CARE_PROVIDER_SITE_OTHER): Payer: Medicaid Other

## 2021-08-29 DIAGNOSIS — Z3042 Encounter for surveillance of injectable contraceptive: Secondary | ICD-10-CM

## 2021-08-29 NOTE — Progress Notes (Signed)
Pt is in the office for depo injection. Administered in [place] and pt tolerated well. Next due Oct 25- Nov 8. .. Administrations This Visit     medroxyPROGESTERone (DEPO-PROVERA) injection 150 mg     Admin Date 08/29/2021 Action Given Dose 150 mg Route Intramuscular Administered By Hinton Lovely, RN

## 2021-09-26 ENCOUNTER — Encounter (HOSPITAL_COMMUNITY): Payer: Self-pay | Admitting: Emergency Medicine

## 2021-09-26 ENCOUNTER — Other Ambulatory Visit: Payer: Self-pay

## 2021-09-26 ENCOUNTER — Ambulatory Visit (HOSPITAL_COMMUNITY)
Admission: EM | Admit: 2021-09-26 | Discharge: 2021-09-26 | Disposition: A | Discharge status: Home / Self Care | Payer: Medicaid Other | Attending: Internal Medicine | Admitting: Internal Medicine

## 2021-09-26 DIAGNOSIS — B3731 Acute candidiasis of vulva and vagina: Secondary | ICD-10-CM | POA: Insufficient documentation

## 2021-09-26 DIAGNOSIS — N949 Unspecified condition associated with female genital organs and menstrual cycle: Secondary | ICD-10-CM

## 2021-09-26 DIAGNOSIS — N9489 Other specified conditions associated with female genital organs and menstrual cycle: Secondary | ICD-10-CM

## 2021-09-26 LAB — POCT URINALYSIS DIPSTICK, ED / UC
Glucose, UA: NEGATIVE mg/dL
Hgb urine dipstick: NEGATIVE
Ketones, ur: NEGATIVE mg/dL
Leukocytes,Ua: NEGATIVE
Nitrite: NEGATIVE
Protein, ur: 30 mg/dL — AB
Specific Gravity, Urine: >=1.03 (ref 1.005–1.030)
Urobilinogen, UA: 0.2 mg/dL (ref 0.0–1.0)
pH: 5 (ref 5.0–8.0)

## 2021-09-26 LAB — POC URINE PREG, ED: Preg Test, Ur: NEGATIVE

## 2021-09-26 NOTE — ED Triage Notes (Signed)
Symptoms started 4 days ago.  Complains of throat burning like vagina.  Reports vaginal discharge.  Reports urine is brown

## 2021-09-26 NOTE — Discharge Instructions (Signed)
Abstain from sexual intercourse until we call you with lab results If you have worsening vaginal pain, burning or discharge please return to urgent care to be reevaluated.

## 2021-09-27 NOTE — ED Provider Notes (Signed)
[place]    CSN: [id number] Arrival date & time: 09/26/21  5631      History   Chief Complaint Chief Complaint  Patient presents with   Sore Throat   vaginal burning    HPI Leah Cruz is a 41 y.o. female comes to the urgent care with 4 to 5-day history of burning sensation in the vaginal area as well as burning sensation in the mouth.  Patient says that symptoms started after she was engaged in oral and vaginal sex with her sexual partner.  Patient believes that his sexual partner has other sexual partners as well.  She denies any vaginal discharge.  No dysuria, urgency or frequency.  No lower abdominal pain.  No sore throat.  No fever or chills.  No difficulty swallowing.  Patient denies any rash in the mouth, throat or the vaginal area.   HPI  Past Medical History:  Diagnosis Date   HSV-2 infection     Patient Active Problem List   Diagnosis Date Noted   History of loop electrical excision procedure (LEEP) 07/25/2021   Severe dysplasia of cervix (CIN III) 11/07/2016   LGSIL on Pap smear of cervix 09/11/2016   Herpes genitalis in women 11/24/2013    Past Surgical History:  Procedure Laterality Date   BREAST BIOPSY     WISDOM TOOTH EXTRACTION      OB History     Gravida  3   Para  2   Term  2   Preterm      AB  1   Living  2      SAB      IAB  1   Ectopic      Multiple      Live Births  2            Home Medications    Prior to Admission medications   Medication Sig Start Date End Date Taking? Authorizing Provider  acyclovir (ZOVIRAX) 400 MG tablet Take 1 tablet by mouth twice daily 07/30/21   Shelly Bombard, MD  medroxyPROGESTERone Acetate 150 MG/ML SUSY INJECT 1 ML (150 MG TOTAL) INTO THE KSIN EVERY 3 MONTHES. 08/27/21   Chancy Milroy, MD    Family History History reviewed. No pertinent family history.  Social History Social History   Tobacco Use   Smoking status: Former    Packs/day: 0.50    Years: 15.00     Total pack years: 7.50    Types: Cigarettes   Smokeless tobacco: Never  Vaping Use   Vaping Use: Never used  Substance Use Topics   Alcohol use: Yes    Comment: occasional   Drug use: No     Allergies   Patient has no known allergies.   Review of Systems Review of Systems  HENT: Negative.    Respiratory: Negative.    Cardiovascular: Negative.   Gastrointestinal: Negative.   Genitourinary:  Positive for vaginal pain. Negative for dysuria, frequency, urgency and vaginal discharge.  Skin: Negative.   Neurological: Negative.      Physical Exam Triage Vital Signs ED Triage Vitals  Enc Vitals Group     BP 09/26/21 0831 108/77     Pulse Rate 09/26/21 0831 65     Resp 09/26/21 0831 18     Temp 09/26/21 0831 98.8 F (37.1 C)     Temp Source 09/26/21 0831 Oral     SpO2 09/26/21 0831 98 %     Weight --  Height --      Head Circumference --      Peak Flow --      Pain Score 09/26/21 0829 8     Pain Loc --      Pain Edu? --      Excl. in [place]? --    No data found.  Updated Vital Signs BP 108/77 (BP Location: Left Arm)   Pulse 65   Temp 98.8 F (37.1 C) (Oral)   Resp 18   SpO2 98%   Visual Acuity Right Eye Distance:   Left Eye Distance:   Bilateral Distance:    Right Eye Near:   Left Eye Near:    Bilateral Near:     Physical Exam Vitals and nursing note reviewed.  Constitutional:      General: She is not in acute distress.    Appearance: She is not ill-appearing.  HENT:     Right Ear: Tympanic membrane normal.     Left Ear: Tympanic membrane normal.     Mouth/Throat:     Mouth: Mucous membranes are moist. No oral lesions.     Pharynx: Oropharynx is clear. No oropharyngeal exudate or uvula swelling.     Tonsils: No tonsillar exudate or tonsillar abscesses. 0 on the right. 0 on the left.  Cardiovascular:     Rate and Rhythm: Normal rate and regular rhythm.  Pulmonary:     Effort: Pulmonary effort is normal.     Breath sounds: Normal breath  sounds.  Neurological:     Mental Status: She is alert.      UC Treatments / Results  Labs (all labs ordered are listed, but only abnormal results are displayed) Labs Reviewed  POCT URINALYSIS DIPSTICK, ED / UC - Abnormal; Notable for the following components:      Result Value   Bilirubin Urine SMALL (*)    Protein, ur 30 (*)    All other components within normal limits  POC URINE PREG, ED  CERVICOVAGINAL ANCILLARY ONLY    EKG   Radiology No results found.  Procedures Procedures (including critical care time)  Medications Ordered in UC Medications - No data to display  Initial Impression / Assessment and Plan / UC Course  I have reviewed the triage vital signs and the nursing notes.  Pertinent labs & imaging results that were available during my care of the patient were reviewed by me and considered in my medical decision making (see chart for details).     1.  Vaginal burning: Cervicovaginal swab for GC/chlamydia/trichomonas Point-of-care urinalysis is significant for bilirubin and protein Urine pregnancy test is negative Abstain from sexual intercourse till lab results are available. Return to urgent care. Final Clinical Impressions(s) / UC Diagnoses   Final diagnoses:  Vaginal burning     Discharge Instructions      Abstain from sexual intercourse until we call you with lab results If you have worsening vaginal pain, burning or discharge please return to urgent care to be reevaluated.   ED Prescriptions   None    PDMP not reviewed this encounter.   Chase Picket, MD 09/27/21 [phone]

## 2021-09-28 LAB — CERVICOVAGINAL ANCILLARY ONLY
Bacterial Vaginitis (gardnerella): NEGATIVE
Candida Glabrata: NEGATIVE
Candida Vaginitis: POSITIVE — AB
Chlamydia: NEGATIVE
Comment: NEGATIVE
Comment: NEGATIVE
Comment: NEGATIVE
Comment: NEGATIVE
Comment: NEGATIVE
Comment: NORMAL
Neisseria Gonorrhea: NEGATIVE
Trichomonas: NEGATIVE

## 2021-10-01 ENCOUNTER — Telehealth (HOSPITAL_COMMUNITY): Payer: Self-pay | Admitting: Emergency Medicine

## 2021-10-01 MED ORDER — FLUCONAZOLE 150 MG PO TABS: 150.0000 mg | ORAL_TABLET | Freq: Once | ORAL | 0 refills | Status: AC

## 2021-11-15 ENCOUNTER — Other Ambulatory Visit: Payer: Self-pay | Admitting: Obstetrics

## 2021-11-15 DIAGNOSIS — Z3042 Encounter for surveillance of injectable contraceptive: Secondary | ICD-10-CM

## 2021-11-15 DIAGNOSIS — Z01419 Encounter for gynecological examination (general) (routine) without abnormal findings: Secondary | ICD-10-CM

## 2021-11-19 ENCOUNTER — Ambulatory Visit (INDEPENDENT_AMBULATORY_CARE_PROVIDER_SITE_OTHER): Payer: Medicaid Other | Admitting: General Practice

## 2021-11-19 DIAGNOSIS — Z3042 Encounter for surveillance of injectable contraceptive: Secondary | ICD-10-CM | POA: Diagnosis not present

## 2021-11-19 MED ORDER — MEDROXYPROGESTERONE ACETATE 150 MG/ML IM SUSP
150.0000 mg | INTRAMUSCULAR | Status: AC
  Administered 2021-11-19: 150 mg via INTRAMUSCULAR

## 2021-11-19 NOTE — Progress Notes (Signed)
Date last pap: 07-25-20. Last Depo-Provera: 08-29-21. Side Effects if any: Pt tolerated well. Serum HCG indicated? Depo given on schedule. Depo-Provera 150 mg IM given by: Arlie Solomons, CMA in the [place] per pt request. Next appointment due .1/15-1/29

## 2022-04-17 ENCOUNTER — Encounter (HOSPITAL_COMMUNITY): Payer: Self-pay

## 2022-04-17 ENCOUNTER — Ambulatory Visit (HOSPITAL_COMMUNITY)
Admission: EM | Admit: 2022-04-17 | Discharge: 2022-04-17 | Disposition: A | Discharge status: Home / Self Care | Payer: Medicaid Other | Attending: Family Medicine | Admitting: Family Medicine

## 2022-04-17 ENCOUNTER — Emergency Department (HOSPITAL_COMMUNITY)
Admission: EM | Admit: 2022-04-17 | Discharge: 2022-04-17 | Disposition: A | Discharge status: Home / Self Care | Payer: Medicaid Other | Attending: Emergency Medicine | Admitting: Emergency Medicine

## 2022-04-17 DIAGNOSIS — R1030 Lower abdominal pain, unspecified: Secondary | ICD-10-CM | POA: Diagnosis not present

## 2022-04-17 DIAGNOSIS — M79642 Pain in left hand: Secondary | ICD-10-CM | POA: Diagnosis not present

## 2022-04-17 DIAGNOSIS — D72829 Elevated white blood cell count, unspecified: Secondary | ICD-10-CM | POA: Insufficient documentation

## 2022-04-17 DIAGNOSIS — R112 Nausea with vomiting, unspecified: Secondary | ICD-10-CM | POA: Diagnosis not present

## 2022-04-17 LAB — POC URINE PREG, ED: Preg Test, Ur: NEGATIVE

## 2022-04-17 LAB — COMPREHENSIVE METABOLIC PANEL
ALT: 21 U/L (ref 0–44)
AST: 24 U/L (ref 15–41)
Albumin: 3.7 g/dL (ref 3.5–5.0)
Alkaline Phosphatase: 54 U/L (ref 38–126)
Anion gap: 11 (ref 5–15)
BUN: 13 mg/dL (ref 6–20)
CO2: 19 mmol/L — ABNORMAL LOW (ref 22–32)
Calcium: 8.8 mg/dL — ABNORMAL LOW (ref 8.9–10.3)
Chloride: 111 mmol/L (ref 98–111)
Creatinine, Ser: 1.02 mg/dL — ABNORMAL HIGH (ref 0.44–1.00)
GFR, Estimated: >60 mL/min (ref >60)
Glucose, Bld: 98 mg/dL (ref 70–99)
Potassium: 3.5 mmol/L (ref 3.5–5.1)
Sodium: 141 mmol/L (ref 135–145)
Total Bilirubin: 1 mg/dL (ref 0.3–1.2)
Total Protein: 6.5 g/dL (ref 6.5–8.1)

## 2022-04-17 LAB — URINALYSIS, ROUTINE W REFLEX MICROSCOPIC
Bilirubin Urine: NEGATIVE
Glucose, UA: NEGATIVE mg/dL
Hgb urine dipstick: NEGATIVE
Ketones, ur: 20 mg/dL — AB
Leukocytes,Ua: NEGATIVE
Nitrite: NEGATIVE
Protein, ur: NEGATIVE mg/dL
Specific Gravity, Urine: 1.028 (ref 1.005–1.030)
pH: 5 (ref 5.0–8.0)

## 2022-04-17 LAB — HCG, QUANTITATIVE, PREGNANCY: hCG, Beta Chain, Quant, S: 4 m[IU]/mL (ref <5)

## 2022-04-17 LAB — LIPASE, BLOOD: Lipase: 32 U/L (ref 11–51)

## 2022-04-17 LAB — CBC
HCT: 39.8 % (ref 36.0–46.0)
Hemoglobin: 13.4 g/dL (ref 12.0–15.0)
MCH: 29.7 pg (ref 26.0–34.0)
MCHC: 33.7 g/dL (ref 30.0–36.0)
MCV: 88.2 fL (ref 80.0–100.0)
Platelets: 244 10*3/uL (ref 150–400)
RBC: 4.51 MIL/uL (ref 3.87–5.11)
RDW: 13.2 % (ref 11.5–15.5)
WBC: 11.6 10*3/uL — ABNORMAL HIGH (ref 4.0–10.5)
nRBC: 0 % (ref 0.0–0.2)

## 2022-04-17 MED ORDER — ONDANSETRON HCL 4 MG PO TABS: 4.0000 mg | ORAL_TABLET | Freq: Four times a day (QID) | ORAL | 0 refills | Status: AC

## 2022-04-17 MED ORDER — LACTATED RINGERS IV BOLUS
1000.0000 mL | Freq: Once | INTRAVENOUS | Status: AC
  Administered 2022-04-17: 1000 mL via INTRAVENOUS

## 2022-04-17 MED ORDER — ALUM & MAG HYDROXIDE-SIMETH 200-200-20 MG/5ML PO SUSP
30.0000 mL | Freq: Once | ORAL | Status: AC
  Administered 2022-04-17: 30 mL via ORAL
  Filled 2022-04-17: qty 30

## 2022-04-17 MED ORDER — LIDOCAINE VISCOUS HCL 2 % MT SOLN
15.0000 mL | Freq: Once | OROMUCOSAL | Status: AC
  Administered 2022-04-17: 15 mL via ORAL
  Filled 2022-04-17: qty 15

## 2022-04-17 MED ORDER — LORAZEPAM 2 MG/ML IJ SOLN
INTRAMUSCULAR | Status: AC
  Filled 2022-04-17: qty 1

## 2022-04-17 MED ORDER — LORAZEPAM 2 MG/ML IJ SOLN
1.0000 mg | Freq: Once | INTRAMUSCULAR | Status: AC
  Administered 2022-04-17: 1 mg via INTRAVENOUS

## 2022-04-17 MED ORDER — SODIUM CHLORIDE 0.9 % IV BOLUS
1000.0000 mL | Freq: Once | INTRAVENOUS | Status: AC
  Administered 2022-04-17: 1000 mL via INTRAVENOUS

## 2022-04-17 MED ORDER — ONDANSETRON HCL 4 MG/2ML IJ SOLN
4.0000 mg | Freq: Once | INTRAMUSCULAR | Status: AC
  Administered 2022-04-17: 4 mg via INTRAVENOUS
  Filled 2022-04-17: qty 2

## 2022-04-17 NOTE — ED Notes (Signed)
Report given to [place], RN CN

## 2022-04-17 NOTE — Discharge Instructions (Signed)
I recommend plenty of fluids, rest, bland diet until your stomach pain returns to normal

## 2022-04-17 NOTE — ED Notes (Signed)
Notified [facility] 

## 2022-04-17 NOTE — ED Notes (Signed)
Patient is being discharged from the Urgent Care and sent to the Emergency Department via [place]. Per Dr. Mannie Stabile, patient is in need of higher level of care due to abdominal pain. Patient is aware and verbalizes understanding of plan of care.  Vitals:   04/17/22 1901  BP: (!) 150/99  Pulse: 71  Resp: 16  Temp: 98.1 F (36.7 C)  SpO2: 100%

## 2022-04-17 NOTE — ED Provider Notes (Addendum)
[place]   [id number] 04/17/22 Arrival Time: [id number]  ASSESSMENT & PLAN:  1. Lower abdominal pain   2. Left hand pain    UPT negative. Pulled to rm 6. Actively screaming regarding pain in her abdomen and pain in her left hand, so much so, she is not answering most of my questions. Apparently has been vomiting since last evening. Denies diarrhea. No LMP recorded. Patient has had an injection. On Depo.  Meds ordered this encounter  Medications   LORazepam (ATIVAN) injection 1 mg   sodium chloride 0.9 % bolus 1,000 mL   Calmer after Ativan. Able to clarify that she has been frequently vomiting since last evening; "yellow and green".  Is TTP mostly over LLQ . Still without guarding/rebound.  Does smoke THC. Denies recent alcohol use. Denies recreational drug use.  Feel she needs a higher level of care than I can provide here. To ED via [facility]; stable upon discharge.  BP (!) 150/99 (BP Location: Right Arm) Comment: unable to get b/p due to pt being in sever pain  Pulse 71   Temp 98.1 F (36.7 C) (Oral)   Resp 16   SpO2 100%   I think her hand pain was related to BP cuff; is better now. After Visit Summary given.  OBJECTIVE:  Vitals:   04/17/22 1901  BP: (!) 150/99  Pulse: 71  Resp: 16  Temp: 98.1 F (36.7 C)  TempSrc: Oral  SpO2: 100%    General appearance: initially pt with incessant screaming about her abdominal and left hand pain HEENT: [place]; AT Lungs: unlabored respirations Abdomen: soft; without distention; mild  and poorly localized tenderness to palpation over LLQ ; normal bowel sounds; without masses or organomegaly; without guarding or rebound tenderness Back: without reported CVA tenderness; FROM at waist Extremities: without LE edema; symmetrical; without gross deformities; L hand appears normal with FROM of all fingers Skin: warm and dry Neurologic: normal gait Psychological: alert and cooperative; normal mood and affect  Labs:  Labs  Reviewed  POC URINE PREG, ED    No Known Allergies                                             Past Medical History:  Diagnosis Date   HSV-2 infection     Social History   Socioeconomic History   Marital status: Single    Spouse name: Not on file   Number of children: Not on file   Years of education: Not on file   Highest education level: Not on file  Occupational History   Not on file  Tobacco Use   Smoking status: Former    Packs/day: 0.50    Years: 15.00    Additional pack years: 0.00    Total pack years: 7.50    Types: Cigarettes   Smokeless tobacco: Never  Vaping Use   Vaping Use: Never used  Substance and Sexual Activity   Alcohol use: Yes    Comment: occasional   Drug use: No   Sexual activity: Not Currently    Partners: Male    Birth control/protection: Injection  Other Topics Concern   Not on file  Social History Narrative   Not on file   Social Determinants of Health   Financial Resource Strain: Not on file  Food Insecurity: Not on file  Transportation Needs: Not on file  Physical Activity: Not on file  Stress: Not on file  Social Connections: Not on file  Intimate Partner Violence: Not on file    History reviewed. No pertinent family history.   Vanessa Kick, MD 04/17/22 [id number]    Vanessa Kick, MD 04/17/22 [phone]

## 2022-04-17 NOTE — ED Triage Notes (Signed)
PT BIB EMS from UC with c/o abdominal pain, nausea, and vomiting since yesterday. Pt became hysterical at [facility] and was given 1mg  IV ativan which was effective.

## 2022-04-17 NOTE — ED Triage Notes (Signed)
Pt is here for sever abdominal pain and vomiting , pt left hand has been cramping since  x 1day

## 2022-04-17 NOTE — ED Provider Notes (Signed)
[place] [facility] Provider Note   CSN: [id number] Arrival date & time: 04/17/22  1953     History  Chief Complaint  Patient presents with   Abdominal Pain   Nausea   Emesis    Leah Cruz is a 42 y.o. female with past medical history significant for no significant previous intra-abdominal surgeries who presents with concern for abdominal pain, nausea, vomiting since yesterday.  Patient reports that she ate any food truck, did have some alcohol at that time as well, patient was quite stressed, and seen in urgent care, received 1 mg IV Ativan and reports that her pain is significantly improved at this time.   Abdominal Pain Associated symptoms: vomiting   Emesis Associated symptoms: abdominal pain        Home Medications Prior to Admission medications   Medication Sig Start Date End Date Taking? Authorizing Provider  ondansetron (ZOFRAN) 4 MG tablet Take 1 tablet (4 mg total) by mouth every 6 (six) hours. 04/17/22  Yes Atanacio Melnyk H, PA-C  acyclovir (ZOVIRAX) 400 MG tablet Take 1 tablet by mouth twice daily 07/30/21   Shelly Bombard, MD  medroxyPROGESTERone Acetate 150 MG/ML SUSY INJECT 1 ML (150 MG TOTAL) INTO THE KSIN EVERY 3 MONTHES. 11/16/21   Shelly Bombard, MD      Allergies    Patient has no known allergies.    Review of Systems   Review of Systems  Gastrointestinal:  Positive for abdominal pain and vomiting.  All other systems reviewed and are negative.   Physical Exam Updated Vital Signs BP 115/69   Pulse 70   Temp 98.6 F (37 C)   Resp (!) 24   Ht 5\' 6"  (1.676 m)   Wt 65.8 kg   SpO2 98%   BMI 23.40 kg/m  Physical Exam Vitals and nursing note reviewed.  Constitutional:      General: She is not in acute distress.    Appearance: Normal appearance.  HENT:     Head: Normocephalic and atraumatic.  Eyes:     General:        Right eye: No discharge.        Left eye: No discharge.  Cardiovascular:      Rate and Rhythm: Normal rate and regular rhythm.     Heart sounds: No murmur heard.    No friction rub. No gallop.  Pulmonary:     Effort: Pulmonary effort is normal.     Breath sounds: Normal breath sounds.  Abdominal:     General: Bowel sounds are normal.     Palpations: Abdomen is soft.     Comments: Mild tenderness of the lower abdomen without rebound, rigidity, guarding. No focal mcburney's point tenderness  Skin:    General: Skin is warm and dry.     Capillary Refill: Capillary refill takes less than 2 seconds.  Neurological:     Mental Status: She is alert and oriented to person, place, and time.  Psychiatric:        Mood and Affect: Mood normal.        Behavior: Behavior normal.     ED Results / Procedures / Treatments   Labs (all labs ordered are listed, but only abnormal results are displayed) Labs Reviewed  COMPREHENSIVE METABOLIC PANEL - Abnormal; Notable for the following components:      Result Value   CO2 19 (*)    Creatinine, Ser 1.02 (*)    Calcium 8.8 (*)  All other components within normal limits  CBC - Abnormal; Notable for the following components:   WBC 11.6 (*)    All other components within normal limits  URINALYSIS, ROUTINE W REFLEX MICROSCOPIC - Abnormal; Notable for the following components:   APPearance HAZY (*)    Ketones, ur 20 (*)    All other components within normal limits  LIPASE, BLOOD  HCG, QUANTITATIVE, PREGNANCY    EKG None  Radiology No results found.  Procedures Procedures    Medications Ordered in ED Medications  lactated ringers bolus 1,000 mL ( Intravenous Stopped 04/17/22 2146)  alum & mag hydroxide-simeth (MAALOX/MYLANTA) 200-200-20 MG/5ML suspension 30 mL (30 mLs Oral Given 04/17/22 2026)    And  lidocaine (XYLOCAINE) 2 % viscous mouth solution 15 mL (15 mLs Oral Given 04/17/22 2026)  ondansetron (ZOFRAN) injection 4 mg (4 mg Intravenous Given 04/17/22 2027)    ED Course/ Medical Decision Making/ A&P                              Medical Decision Making Amount and/or Complexity of Data Reviewed Labs: ordered.  Risk OTC drugs. Prescription drug management.  This patient is a 42 y.o. female  who presents to the ED for concern of abdominal pain, nausea, vomiting.   Differential diagnoses prior to evaluation: The emergent differential diagnosis includes, but is not limited to, The causes of generalized abdominal pain include but are not limited to AAA, mesenteric ischemia, appendicitis, diverticulitis, DKA, gastritis, gastroenteritis, AMI, nephrolithiasis, pancreatitis, peritonitis, adrenal insufficiency,lead poisoning, iron toxicity, intestinal ischemia, constipation, UTI,SBO/LBO, splenic rupture, biliary disease, IBD, IBS, PUD, or hepatitis. Ectopic pregnancy, ovarian torsion, PID.  Marland Kitchen This is not an exhaustive differential.   Past Medical History / Co-morbidities: Overall noncontributory, no previous intra-abdominal surgery  Physical Exam: Physical exam performed. The pertinent findings include: Patient with mild tenderness without rebound, rigidity, guarding in the lower quadrants, no distention, normal bowel sounds throughout.  Lab Tests/Imaging studies: I personally interpreted labs/imaging and the pertinent results include: CMP notable for mild bicarb deficit, CO2 19.  Her UA is overall unremarkable with some mild ketones, white blood cells mildly elevated 11.6, no other significant abnormalities to CBC, her lipase is normal.   Medications: I ordered medication including patient with improvement after fluid bolus, Zofran, Maalox, Mylanta.  I have reviewed the patients home medicines and have made adjustments as needed.   Disposition: After consideration of the diagnostic results and the patients response to treatment, I feel that patient with unclear etiology of abdominal pain, I suspect food poisoning versus gastroenteritis, versus enteritis, versus other.   emergency department workup  does not suggest an emergent condition requiring admission or immediate intervention beyond what has been performed at this time. The plan is: as above. The patient is safe for discharge and has been instructed to return immediately for worsening symptoms, change in symptoms or any other concerns.  Final Clinical Impression(s) / ED Diagnoses Final diagnoses:  Lower abdominal pain  Nausea and vomiting, unspecified vomiting type    Rx / DC Orders ED Discharge Orders          Ordered    ondansetron (ZOFRAN) 4 MG tablet  Every 6 hours        04/17/22 2251              Dorien Chihuahua 04/17/22 2253    Fransico Meadow, MD 04/18/22 1241

## 2022-04-29 ENCOUNTER — Telehealth: Payer: Self-pay | Admitting: *Deleted

## 2022-04-29 NOTE — Telephone Encounter (Signed)
TC from pt who is adamant that she had her last Depo in January. No visit noted in January on appointment desk, no nurse note or MAR documentation from January either Pt advised of above. Continues to argue with RN. Art gallery manager would review her chart and call her back. Pharmacy confirms pt picked up Depo in October not January.

## 2022-05-01 ENCOUNTER — Ambulatory Visit (INDEPENDENT_AMBULATORY_CARE_PROVIDER_SITE_OTHER): Payer: Medicaid Other

## 2022-05-01 DIAGNOSIS — Z3042 Encounter for surveillance of injectable contraceptive: Secondary | ICD-10-CM | POA: Diagnosis not present

## 2022-05-01 DIAGNOSIS — Z3202 Encounter for pregnancy test, result negative: Secondary | ICD-10-CM | POA: Diagnosis not present

## 2022-05-01 DIAGNOSIS — Z30013 Encounter for initial prescription of injectable contraceptive: Secondary | ICD-10-CM

## 2022-05-01 LAB — POCT URINE PREGNANCY: Preg Test, Ur: NEGATIVE

## 2022-05-01 MED ORDER — MEDROXYPROGESTERONE ACETATE 150 MG/ML IM SUSY
150.0000 mg | PREFILLED_SYRINGE | Freq: Once | INTRAMUSCULAR | Status: AC
  Administered 2022-05-01: 150 mg via INTRAMUSCULAR

## 2022-05-01 MED ORDER — MEDROXYPROGESTERONE ACETATE 150 MG/ML IM SUSP: 150.0000 mg | INTRAMUSCULAR | 0 refills | Status: DC

## 2022-05-01 NOTE — Progress Notes (Signed)
Patient presents for depo restart. Last UPT on 3/24 at urgent care. Patient denies having any intercourse within the last two weeks.   Date last pap: 07/25/20 Last Depo-Provera: 11/19/21. Side Effects if any: N/A. Serum HCG indicated? N/A. Depo-Provera 150 mg IM given by: Dreama Saa., RN. Inj given in LUOQ. Patient tolerated well. Next appointment due June 25-July 9.   Office supply given. Patient did not have any refills at the pharmacy

## 2022-06-29 IMAGING — CT CT ABD-PELV W/O CM
2 of 4 series · 17 of 46 positions shown, 19 images · non-contrast
Comparison: 11/22/2010

CLINICAL DATA: Postprandial abdominal pain

EXAM:
CT ABDOMEN AND PELVIS WITHOUT CONTRAST
TECHNIQUE: Multidetector CT imaging of the abdomen and pelvis was performed
following the standard protocol without IV contrast.

[Series 2: axial st · axial · 0.63mm/px · z∈[+1099,+1454]mm · 14 of 79 slices shown, 16 images]
[im 4/79  soft-tissue]
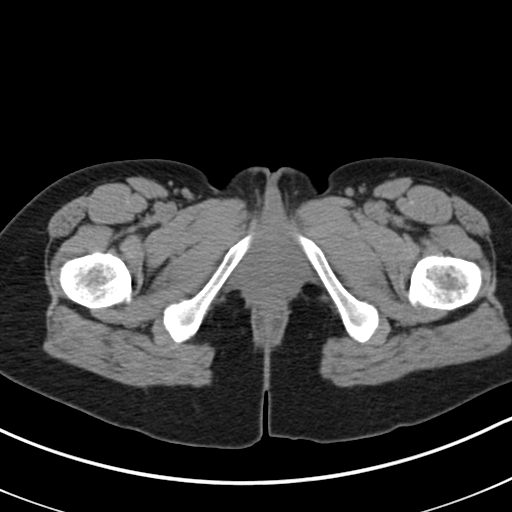
[im 4/79  bone]
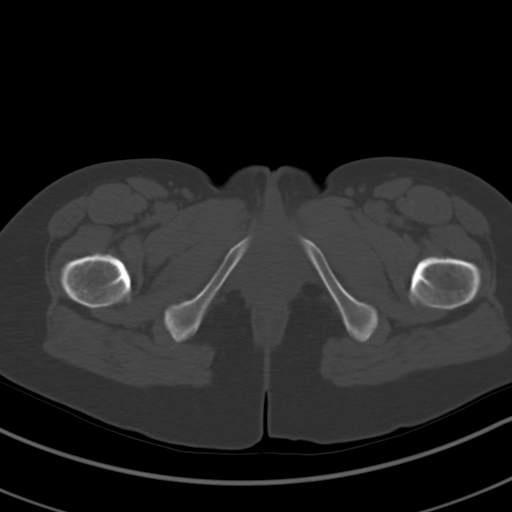
[im 12/79  soft-tissue]
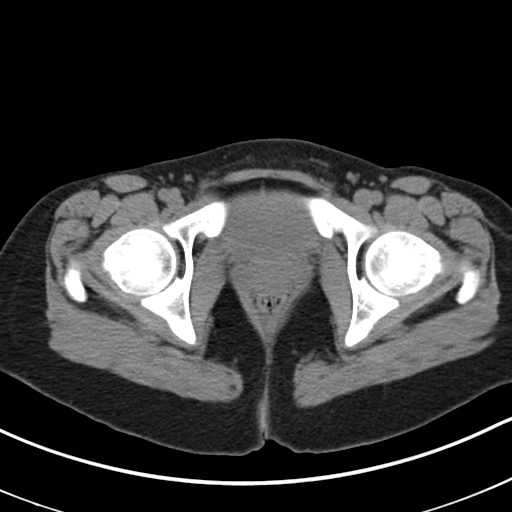
[im 16/79  soft-tissue]
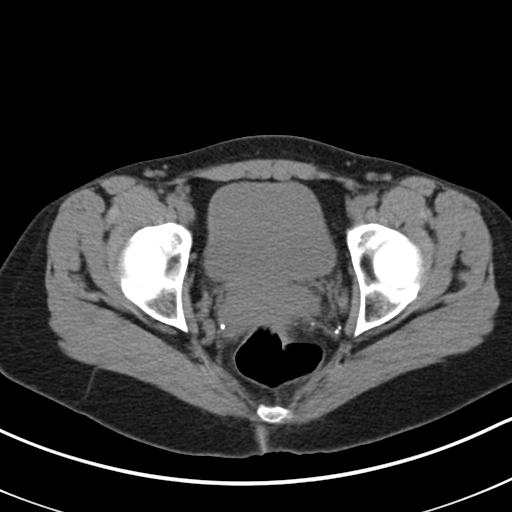
[im 20/79  soft-tissue]
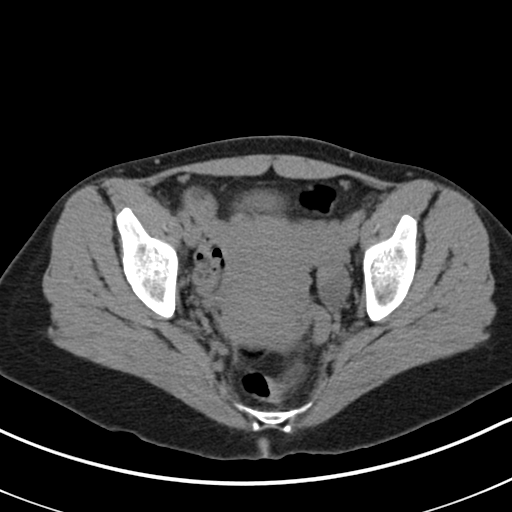
[im 28/79  soft-tissue]
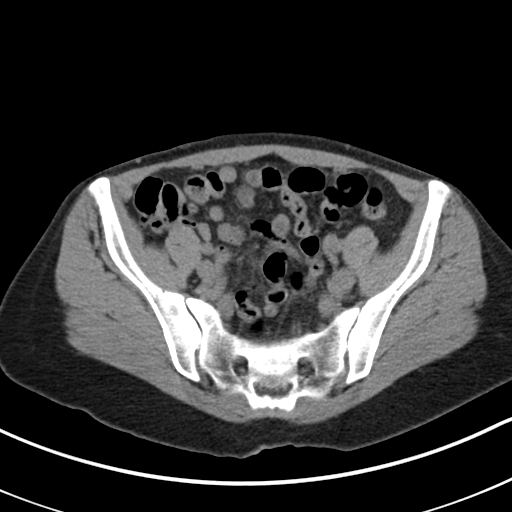
[im 32/79  soft-tissue]
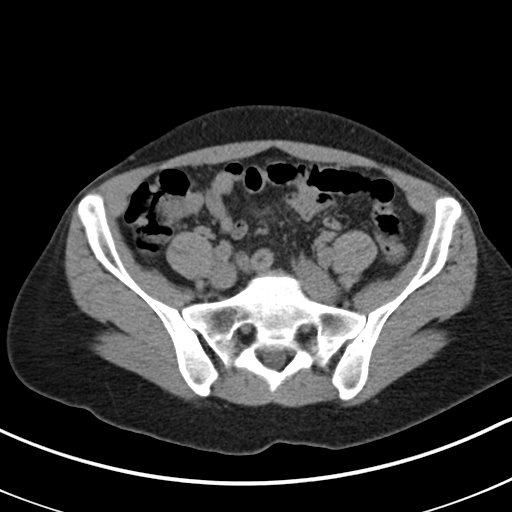
[im 36/79  soft-tissue]
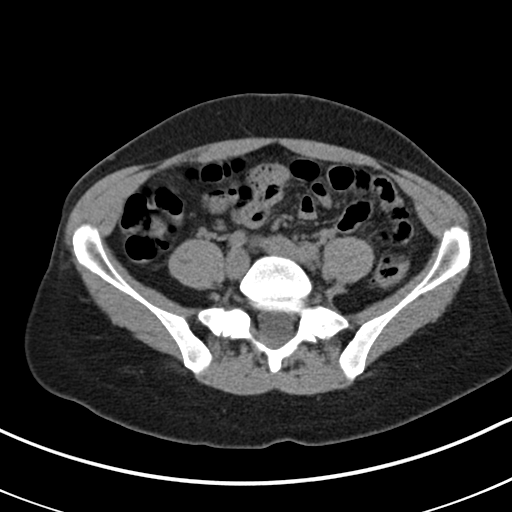
[im 43/79  soft-tissue]
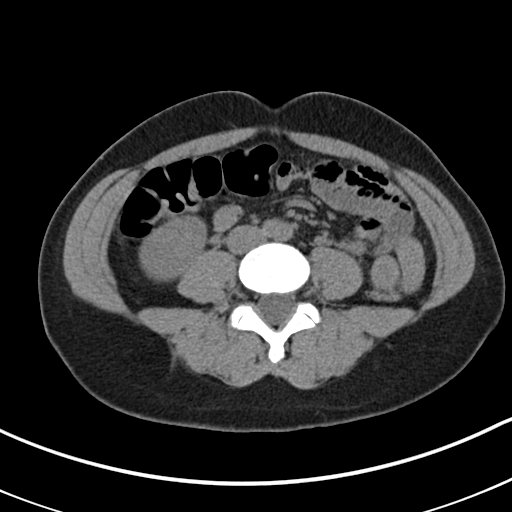
[im 47/79  soft-tissue]
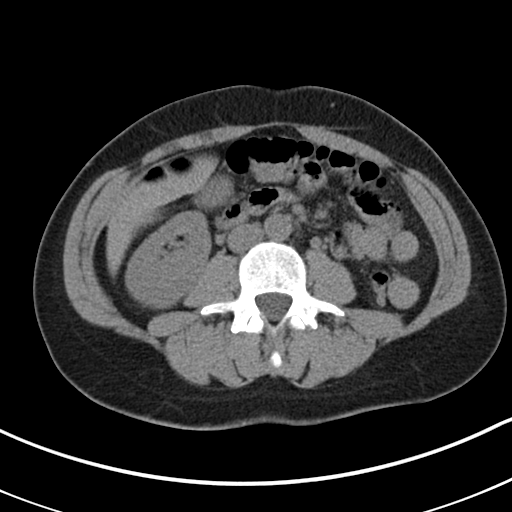
[im 47/79  bone]
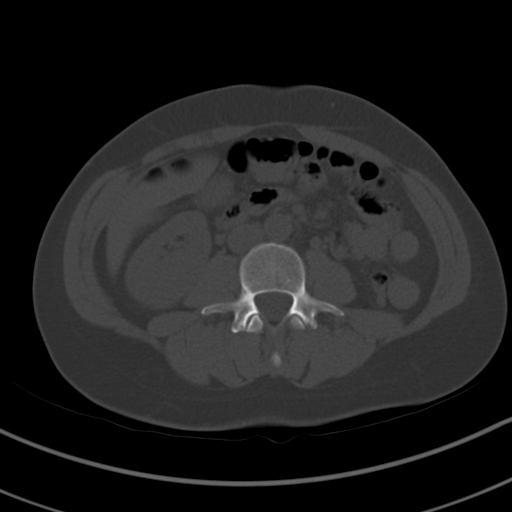
[im 51/79  soft-tissue]
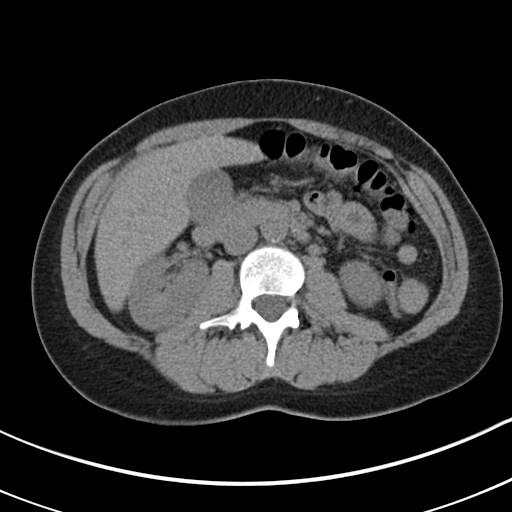
[im 59/79  soft-tissue]
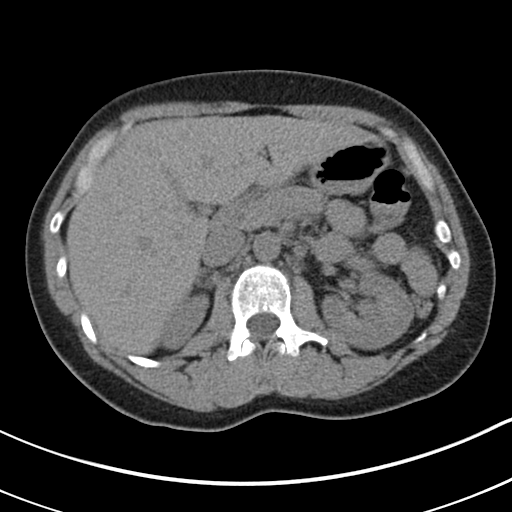
[im 63/79  soft-tissue]
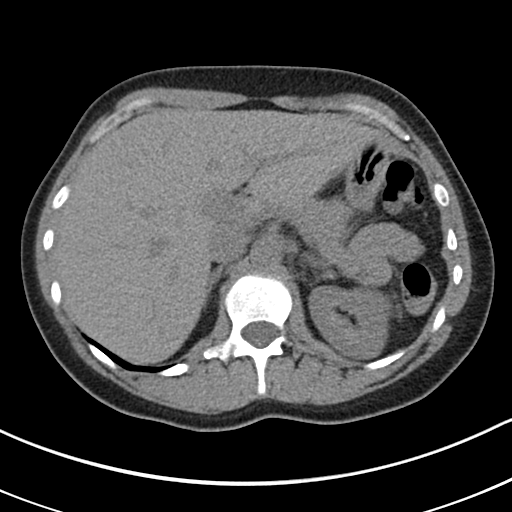
[im 67/79  soft-tissue]
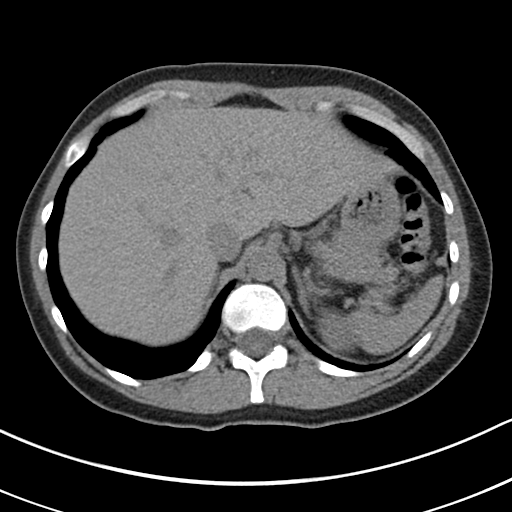
[im 75/79  soft-tissue]
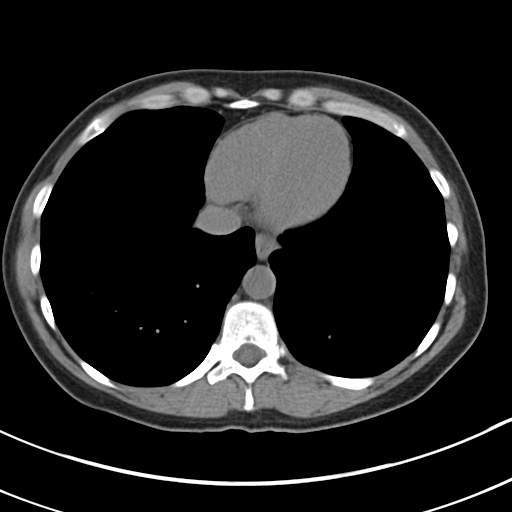

[Series 4: coronal st · coronal · 0.69mm/px · 3 of 120 slices shown]
[im 40/120  soft-tissue]
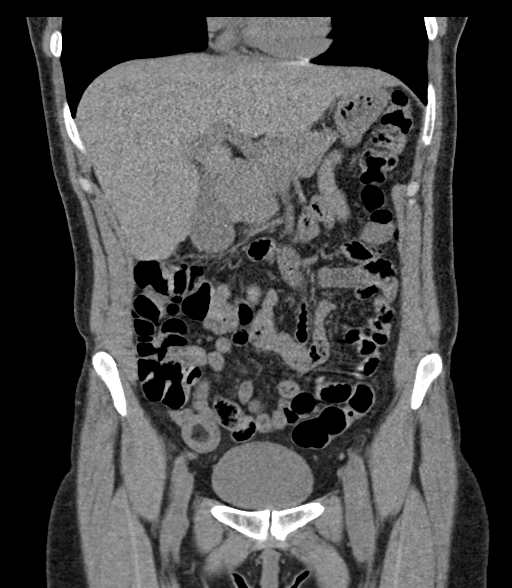
[im 53/120  soft-tissue]
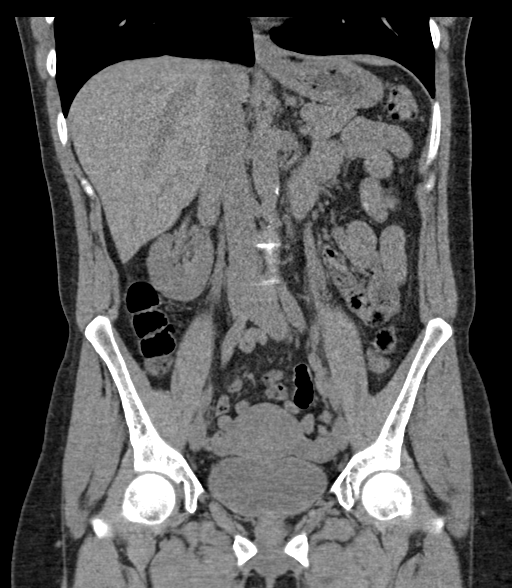
[im 67/120  soft-tissue]
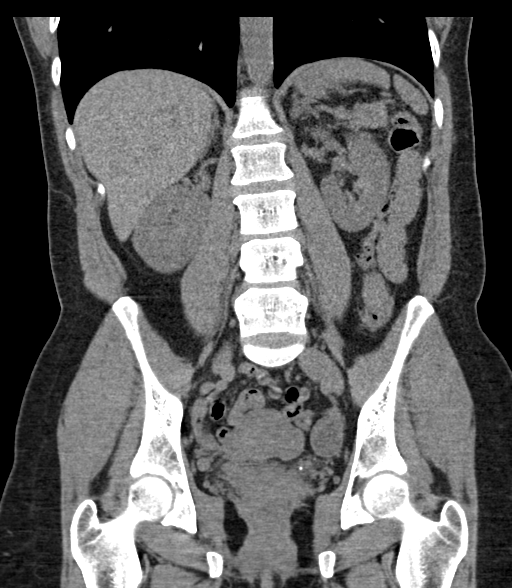

[17 of 46 positions shown; findings below may reference images not displayed]

FINDINGS: Lower chest: The visualized lung bases are clear bilaterally. The
visualized heart and pericardium are unremarkable.

Hepatobiliary: No focal liver abnormality is seen. No gallstones,
gallbladder wall thickening, or biliary dilatation.

Pancreas: Unremarkable

Spleen: Unremarkable

Adrenals/Urinary Tract: Adrenal glands are unremarkable. Kidneys are
normal, without renal calculi, focal lesion, or hydronephrosis.
Bladder is unremarkable.

Stomach/Bowel: Stomach is within normal limits. Appendix appears
normal. No evidence of bowel wall thickening, distention, or
inflammatory changes. No free intraperitoneal gas or fluid.

Vascular/Lymphatic: No significant vascular findings are present. No
enlarged abdominal or pelvic lymph nodes.

Reproductive: 15 mm macroscopic fat containing lesion is seen within
the right ovary in keeping with a ovarian dermoid. Pelvic organs are
otherwise unremarkable.

Other: Tiny fat containing umbilical hernia.

Musculoskeletal: No acute bone abnormality. No lytic or blastic bone
lesion identified.
IMPRESSION: No acute intra-abdominal pathology identified. No definite
radiographic explanation for the patient's reported symptoms.

15 mm right ovarian dermoid.

## 2022-07-29 ENCOUNTER — Ambulatory Visit (INDEPENDENT_AMBULATORY_CARE_PROVIDER_SITE_OTHER): Payer: Medicaid Other | Admitting: *Deleted

## 2022-07-29 VITALS — BP 130/91 | Wt 150.0 lb

## 2022-07-29 DIAGNOSIS — Z3042 Encounter for surveillance of injectable contraceptive: Secondary | ICD-10-CM

## 2022-07-29 MED ORDER — MEDROXYPROGESTERONE ACETATE 150 MG/ML IM SUSP
150.0000 mg | Freq: Once | INTRAMUSCULAR | Status: AC
  Administered 2022-07-29: 150 mg via INTRAMUSCULAR

## 2022-07-29 NOTE — Progress Notes (Signed)
Date last pap: 07/25/20. Last Depo-Provera: 05/01/22 . Side Effects if any: NA . Serum HCG indicated? NA . Depo-Provera 150 mg IM given by: Chriss Driver. Next appointment due 9/23-10/7.  Pt supplied

## 2022-07-30 NOTE — Progress Notes (Signed)
Patient was assessed and managed by nursing staff during this encounter. I have reviewed the chart and agree with the documentation and plan. I have also made any necessary editorial changes.  Bryant Lipps A Nichollas Perusse, MD 07/30/2022 1:10 PM   

## 2022-09-11 ENCOUNTER — Other Ambulatory Visit: Payer: Self-pay | Admitting: Obstetrics

## 2022-09-11 DIAGNOSIS — A6009 Herpesviral infection of other urogenital tract: Secondary | ICD-10-CM

## 2022-10-21 ENCOUNTER — Ambulatory Visit (INDEPENDENT_AMBULATORY_CARE_PROVIDER_SITE_OTHER): Payer: Medicaid Other | Admitting: Emergency Medicine

## 2022-10-21 VITALS — BP 129/90 | HR 90 | Ht 67.0 in | Wt 147.2 lb

## 2022-10-21 DIAGNOSIS — Z3042 Encounter for surveillance of injectable contraceptive: Secondary | ICD-10-CM

## 2022-10-21 MED ORDER — MEDROXYPROGESTERONE ACETATE 150 MG/ML IM SUSP
150.0000 mg | Freq: Once | INTRAMUSCULAR | Status: AC
  Administered 2022-10-21: 150 mg via INTRAMUSCULAR

## 2022-10-21 NOTE — Progress Notes (Signed)
Date last pap: 07/25/2020. Last Depo-Provera: 07/29/2022. Side Effects if any: NA. Serum HCG indicated? NA. Depo-Provera 150 mg IM given by: Resa Miner, RN given into LUOQ, tolerated well in office. Next appointment due Dec 16-Dec 30.

## 2022-11-14 ENCOUNTER — Other Ambulatory Visit: Payer: Self-pay | Admitting: Family Medicine

## 2022-11-14 DIAGNOSIS — A6009 Herpesviral infection of other urogenital tract: Secondary | ICD-10-CM

## 2022-11-15 ENCOUNTER — Other Ambulatory Visit: Payer: Self-pay

## 2022-11-15 DIAGNOSIS — A6009 Herpesviral infection of other urogenital tract: Secondary | ICD-10-CM

## 2022-11-15 MED ORDER — ACYCLOVIR 400 MG PO TABS: 400.0000 mg | ORAL_TABLET | Freq: Two times a day (BID) | ORAL | 0 refills | Status: DC

## 2023-01-06 ENCOUNTER — Other Ambulatory Visit: Payer: Self-pay | Admitting: Obstetrics

## 2023-01-06 ENCOUNTER — Other Ambulatory Visit: Payer: Self-pay | Admitting: Obstetrics and Gynecology

## 2023-01-06 DIAGNOSIS — Z3042 Encounter for surveillance of injectable contraceptive: Secondary | ICD-10-CM

## 2023-01-06 DIAGNOSIS — Z01419 Encounter for gynecological examination (general) (routine) without abnormal findings: Secondary | ICD-10-CM

## 2023-01-06 DIAGNOSIS — A6009 Herpesviral infection of other urogenital tract: Secondary | ICD-10-CM

## 2023-01-07 ENCOUNTER — Other Ambulatory Visit: Payer: Self-pay | Admitting: *Deleted

## 2023-01-07 ENCOUNTER — Encounter: Payer: Self-pay | Admitting: *Deleted

## 2023-01-07 DIAGNOSIS — A6009 Herpesviral infection of other urogenital tract: Secondary | ICD-10-CM

## 2023-01-07 MED ORDER — ACYCLOVIR 400 MG PO TABS: 400.0000 mg | ORAL_TABLET | Freq: Two times a day (BID) | ORAL | 0 refills | Status: DC

## 2023-01-07 NOTE — Progress Notes (Signed)
Pt called to office needing refill for HSV.  Refill sent today - pt will need annual to continue.

## 2023-01-10 ENCOUNTER — Ambulatory Visit (INDEPENDENT_AMBULATORY_CARE_PROVIDER_SITE_OTHER): Payer: Medicaid Other

## 2023-01-10 VITALS — BP 113/81 | HR 83

## 2023-01-10 DIAGNOSIS — Z3042 Encounter for surveillance of injectable contraceptive: Secondary | ICD-10-CM

## 2023-01-10 NOTE — Progress Notes (Signed)
Pt is in the office for depo injection. Administered in RUOQ and pt tolerated well. Next injection is due Mar 7-21. .. Administrations This Visit     medroxyPROGESTERone (DEPO-PROVERA) injection 150 mg     Admin Date 01/10/2023 Action Given Dose 150 mg Route Intramuscular Documented By Katrina Stack, RN

## 2023-02-18 ENCOUNTER — Other Ambulatory Visit: Payer: Self-pay | Admitting: Obstetrics and Gynecology

## 2023-02-18 DIAGNOSIS — Z3042 Encounter for surveillance of injectable contraceptive: Secondary | ICD-10-CM

## 2023-02-18 DIAGNOSIS — Z01419 Encounter for gynecological examination (general) (routine) without abnormal findings: Secondary | ICD-10-CM

## 2023-02-19 ENCOUNTER — Other Ambulatory Visit (HOSPITAL_COMMUNITY)
Admission: RE | Admit: 2023-02-19 | Discharge: 2023-02-19 | Disposition: A | Discharge status: Home / Self Care | Payer: Medicaid Other | Source: Ambulatory Visit | Attending: Obstetrics | Admitting: Obstetrics

## 2023-02-19 ENCOUNTER — Ambulatory Visit: Payer: Medicaid Other | Admitting: Obstetrics

## 2023-02-19 ENCOUNTER — Encounter: Payer: Self-pay | Admitting: Obstetrics

## 2023-02-19 VITALS — BP 119/85 | HR 85 | Ht 67.0 in | Wt 158.4 lb

## 2023-02-19 DIAGNOSIS — Z01419 Encounter for gynecological examination (general) (routine) without abnormal findings: Secondary | ICD-10-CM

## 2023-02-19 DIAGNOSIS — N898 Other specified noninflammatory disorders of vagina: Secondary | ICD-10-CM | POA: Diagnosis not present

## 2023-02-19 DIAGNOSIS — A6009 Herpesviral infection of other urogenital tract: Secondary | ICD-10-CM | POA: Diagnosis not present

## 2023-02-19 DIAGNOSIS — Z1239 Encounter for other screening for malignant neoplasm of breast: Secondary | ICD-10-CM | POA: Diagnosis not present

## 2023-02-19 DIAGNOSIS — R52 Pain, unspecified: Secondary | ICD-10-CM | POA: Diagnosis not present

## 2023-02-19 DIAGNOSIS — Z3042 Encounter for surveillance of injectable contraceptive: Secondary | ICD-10-CM | POA: Diagnosis not present

## 2023-02-19 MED ORDER — MEDROXYPROGESTERONE ACETATE 150 MG/ML IM SUSP: 150.0000 mg | INTRAMUSCULAR | 0 refills | Status: DC

## 2023-02-19 MED ORDER — IBUPROFEN 800 MG PO TABS: 800.0000 mg | ORAL_TABLET | Freq: Three times a day (TID) | ORAL | 5 refills | Status: AC | PRN

## 2023-02-19 MED ORDER — FLUCONAZOLE 150 MG PO TABS: 150.0000 mg | ORAL_TABLET | Freq: Once | ORAL | 0 refills | Status: AC

## 2023-02-19 MED ORDER — ACYCLOVIR 400 MG PO TABS: 400.0000 mg | ORAL_TABLET | Freq: Two times a day (BID) | ORAL | 11 refills | Status: AC

## 2023-02-19 NOTE — Progress Notes (Signed)
Subjective:        Leah Cruz is a 43 y.o. female here for a routine exam.  Current complaints: Vaginal discharge with itching.    Personal health questionnaire:  Is patient [name], have a family history of breast and/or ovarian cancer: no Is there a family history of uterine cancer diagnosed at age < 70, gastrointestinal cancer, urinary tract cancer, family member who is a Personnel officer syndrome-associated carrier: no Is the patient overweight and hypertensive, family history of diabetes, personal history of gestational diabetes, preeclampsia or PCOS: no Is patient over 20, have PCOS,  family history of premature CHD under age 64, diabetes, smoke, have hypertension or peripheral artery disease:  no At any time, has a partner hit, kicked or otherwise hurt or frightened you?: no Over the past 2 weeks, have you felt down, depressed or hopeless?: no Over the past 2 weeks, have you felt little interest or pleasure in doing things?:no   Gynecologic History No LMP recorded. Patient has had an injection. Contraception: Depo-Provera injections Last Pap: 2022. 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 Results were: abnormal  Obstetric History OB History  Gravida Para Term Preterm AB Living  3 2 2  1 2   SAB IAB Ectopic Multiple Live Births   1   2    # Outcome Date GA Lbr Len/2nd Weight Sex Type Anes PTL Lv  3 Term 02/28/12 [redacted]w[redacted]d 13:33 / 00:45 7 lb 15.5 oz (3.615 kg) M Vag-Spont EPI  LIV     Birth Comments: CAH--Normal GAL--Normal Thyroid-Normal Biotinidase- Normal Hemoglobin--Normal, FA Amino Acid Profile-Normal Acylcarnitine Profile-Normal   2 IAB           1 Term      Vag-Spont EPI  LIV    Past Medical History:  Diagnosis Date   HSV-2 infection     Past Surgical History:  Procedure Laterality Date   BREAST BIOPSY     WISDOM TOOTH EXTRACTION       Current Outpatient Medications:    fluconazole (DIFLUCAN) 150 MG tablet, Take 1 tablet (150 mg total) by mouth  once for 1 dose., Disp: 1 tablet, Rfl: 0   ibuprofen (ADVIL) 800 MG tablet, Take 1 tablet (800 mg total) by mouth every 8 (eight) hours as needed., Disp: 30 tablet, Rfl: 5   acyclovir (ZOVIRAX) 400 MG tablet, Take 1 tablet (400 mg total) by mouth 2 (two) times daily., Disp: 30 tablet, Rfl: 11   medroxyPROGESTERone (DEPO-PROVERA) 150 MG/ML injection, Inject 1 mL (150 mg total) into the muscle every 3 (three) months., Disp: 1 mL, Rfl: 0   medroxyPROGESTERone Acetate 150 MG/ML SUSY, INJECT 1 ML INTRAMUSCULARLY ONCE EVERY 3 MONTHS (Patient not taking: Reported on 02/19/2023), Disp: 1 mL, Rfl: 0   ondansetron (ZOFRAN) 4 MG tablet, Take 1 tablet (4 mg total) by mouth every 6 (six) hours. (Patient not taking: Reported on 02/19/2023), Disp: 12 tablet, Rfl: 0  Current Facility-Administered Medications:    medroxyPROGESTERone (DEPO-PROVERA) injection 150 mg, 150 mg, Intramuscular, Q90 days, Conan Bowens, MD, 150 mg at 01/10/23 0820   medroxyPROGESTERone (DEPO-PROVERA) injection 150 mg, 150 mg, Intramuscular, Q90 days, Constant, Peggy, MD, 150 mg at 11/19/21 0920 No Known Allergies  Social History   Tobacco Use   Smoking status: Former    Current packs/day: 0.50    Average packs/day: 0.5 packs/day for 15.0 years (7.5 ttl pk-yrs)    Types: Cigarettes   Smokeless tobacco: Never  Substance Use Topics   Alcohol use: Yes  Comment: occasional    History reviewed. No pertinent family history.    Review of Systems  Constitutional: negative for fatigue and weight loss Respiratory: negative for cough and wheezing Cardiovascular: negative for chest pain, fatigue and palpitations Gastrointestinal: negative for abdominal pain and change in bowel habits Musculoskeletal:negative for myalgias Neurological: negative for gait problems and tremors Behavioral/Psych: negative for abusive relationship, depression Endocrine: negative for temperature intolerance    Genitourinary:negative for abnormal menstrual  periods, genital lesions, hot flashes, sexual problems  Integument/breast: negative for breast lump, breast tenderness, nipple discharge and skin lesion(s)    Objective:       BP 119/85   Pulse 85   Ht 5\' 7"  (1.702 m)   Wt 158 lb 6.4 oz (71.8 kg)   BMI 24.81 kg/m  General:   Alert and no distress  Skin:   no rash or abnormalities  Lungs:   clear to auscultation bilaterally  Heart:   regular rate and rhythm, S1, S2 normal, no murmur, click, rub or gallop  Breasts:   normal without suspicious masses, skin or nipple changes or axillary nodes  Abdomen:  normal findings: no organomegaly, soft, non-tender and no hernia  Pelvis:  External genitalia: normal general appearance Urinary system: urethral meatus normal and bladder without fullness, nontender Vaginal: normal without tenderness, induration or masses Cervix: normal appearance Adnexa: normal bimanual exam Uterus: anteverted and non-tender, normal size   Lab Review Urine pregnancy test Labs reviewed yes Radiologic studies reviewed yes  I have spent a total of 20 minutes of face-to-face time, excluding clinical staff time, reviewing notes and preparing to see patient, ordering tests and/or medications, and counseling the patient.   Assessment:    1. Encounter for gynecological examination with Papanicolaou smear of cervix (Primary) Rx: - Cytology - PAP( [place])  2. Vaginal discharge Rx: - Cervicovaginal ancillary only( [place]) - fluconazole (DIFLUCAN) 150 MG tablet; Take 1 tablet (150 mg total) by mouth once for 1 dose.  Dispense: 1 tablet; Refill: 0  3. Herpes genitalis in women Rx: - acyclovir (ZOVIRAX) 400 MG tablet; Take 1 tablet (400 mg total) by mouth 2 (two) times daily.  Dispense: 30 tablet; Refill: 11  4. Pain Rx: - ibuprofen (ADVIL) 800 MG tablet; Take 1 tablet (800 mg total) by mouth every 8 (eight) hours as needed.  Dispense: 30 tablet; Refill: 5  5. Encounter for surveillance of injectable  contraceptive Rx: - medroxyPROGESTERone (DEPO-PROVERA) 150 MG/ML injection; Inject 1 mL (150 mg total) into the muscle every 3 (three) months.  Dispense: 1 mL; Refill: 0  6. Screening breast examination Rx: - MM Digital Screening; Future     Plan:    Education reviewed: calcium supplements, depression evaluation, low fat, low cholesterol diet, safe sex/STD prevention, self breast exams, and weight bearing exercise. Contraception: Depo-Provera injections. Mammogram ordered. Follow up in: 1 year.   Meds ordered this encounter  Medications   medroxyPROGESTERone (DEPO-PROVERA) 150 MG/ML injection    Sig: Inject 1 mL (150 mg total) into the muscle every 3 (three) months.    Dispense:  1 mL    Refill:  0   acyclovir (ZOVIRAX) 400 MG tablet    Sig: Take 1 tablet (400 mg total) by mouth 2 (two) times daily.    Dispense:  30 tablet    Refill:  11   ibuprofen (ADVIL) 800 MG tablet    Sig: Take 1 tablet (800 mg total) by mouth every 8 (eight) hours as needed.  Dispense:  30 tablet    Refill:  5   fluconazole (DIFLUCAN) 150 MG tablet    Sig: Take 1 tablet (150 mg total) by mouth once for 1 dose.    Dispense:  1 tablet    Refill:  0   Orders Placed This Encounter  Procedures   MM Digital Screening    Standing Status:   Future    Expiration Date:   02/19/2024    Reason for Exam (SYMPTOM  OR DIAGNOSIS REQUIRED):   Screening    Is the patient pregnant?:   No    Preferred imaging location?:   [facility]    Brock Bad, MD, FACOG Attending Obstetrician & Gynecologist, [facility] for [facility], [facility], [place] 02/19/2023

## 2023-02-20 LAB — CERVICOVAGINAL ANCILLARY ONLY
Bacterial Vaginitis (gardnerella): NEGATIVE
Candida Glabrata: NEGATIVE
Candida Vaginitis: NEGATIVE
Chlamydia: NEGATIVE
Comment: NEGATIVE
Comment: NEGATIVE
Comment: NEGATIVE
Comment: NEGATIVE
Comment: NEGATIVE
Comment: NORMAL
Neisseria Gonorrhea: NEGATIVE
Trichomonas: NEGATIVE

## 2023-02-25 LAB — CYTOLOGY - PAP
Adequacy: ABSENT
Comment: NEGATIVE
Diagnosis: NEGATIVE
High risk HPV: NEGATIVE

## 2023-03-06 ENCOUNTER — Ambulatory Visit: Payer: Self-pay

## 2023-03-13 ENCOUNTER — Ambulatory Visit: Payer: Self-pay

## 2023-03-31 ENCOUNTER — Ambulatory Visit: Payer: Medicaid Other

## 2023-03-31 VITALS — BP 131/88 | HR 88

## 2023-03-31 DIAGNOSIS — Z3042 Encounter for surveillance of injectable contraceptive: Secondary | ICD-10-CM

## 2023-03-31 MED ORDER — MEDROXYPROGESTERONE ACETATE 150 MG/ML IM SUSP
150.0000 mg | Freq: Once | INTRAMUSCULAR | Status: AC
  Administered 2023-03-31: 150 mg via INTRAMUSCULAR

## 2023-03-31 NOTE — Progress Notes (Signed)
 Date last pap: 02/19/23. Last Depo-Provera: 01/10/23. Side Effects if any: NA. Serum HCG indicated? NA. Depo-Provera 150 mg IM given by: Karma Ganja, RN. Next appointment due May 26th -June 9th.

## 2023-06-09 ENCOUNTER — Other Ambulatory Visit: Payer: Self-pay | Admitting: Obstetrics

## 2023-06-09 ENCOUNTER — Telehealth: Payer: Self-pay | Admitting: Obstetrics

## 2023-06-09 DIAGNOSIS — Z3042 Encounter for surveillance of injectable contraceptive: Secondary | ICD-10-CM

## 2023-06-09 NOTE — Telephone Encounter (Signed)
 Pt calling stating that she has an appointment on 06/10/2023 and her medication : medroxyPROGESTERone  Acetate 150 MG/ML SUSY (DEPO) need to be sent in to her pharmacy today so that she is able to pick it up and bring it in tomorrow. Pharm: [facility] at [facility]

## 2023-06-10 ENCOUNTER — Ambulatory Visit

## 2023-06-17 ENCOUNTER — Ambulatory Visit

## 2023-06-17 DIAGNOSIS — Z3042 Encounter for surveillance of injectable contraceptive: Secondary | ICD-10-CM

## 2023-06-17 MED ORDER — MEDROXYPROGESTERONE ACETATE 150 MG/ML IM SUSP
150.0000 mg | Freq: Once | INTRAMUSCULAR | Status: AC
  Administered 2023-06-17: 150 mg via INTRAMUSCULAR

## 2023-06-17 NOTE — Progress Notes (Signed)
 Date last pap: 02/19/23. Last Depo-Provera : 03/31/23. Side Effects if any: NA. Serum HCG indicated? NA. Depo-Provera  150 mg IM given by: Artemio Bilberry, RN in [facility]OQ. Next appointment due August 12-26 2025.

## 2023-08-09 ENCOUNTER — Encounter (HOSPITAL_COMMUNITY): Payer: Self-pay

## 2023-08-09 ENCOUNTER — Emergency Department (HOSPITAL_COMMUNITY)

## 2023-08-09 ENCOUNTER — Other Ambulatory Visit (HOSPITAL_COMMUNITY)

## 2023-08-09 ENCOUNTER — Other Ambulatory Visit: Payer: Self-pay

## 2023-08-09 ENCOUNTER — Ambulatory Visit (HOSPITAL_COMMUNITY)
Admission: EM | Admit: 2023-08-09 | Discharge: 2023-08-09 | Disposition: A | Discharge status: Home / Self Care | Attending: Neurology | Admitting: Neurology

## 2023-08-09 ENCOUNTER — Emergency Department (HOSPITAL_COMMUNITY)
Admission: EM | Admit: 2023-08-09 | Discharge: 2023-08-09 | Disposition: A | Discharge status: Home / Self Care | Attending: Emergency Medicine | Admitting: Emergency Medicine

## 2023-08-09 ENCOUNTER — Encounter (HOSPITAL_COMMUNITY): Payer: Self-pay | Admitting: Emergency Medicine

## 2023-08-09 DIAGNOSIS — R1084 Generalized abdominal pain: Secondary | ICD-10-CM

## 2023-08-09 DIAGNOSIS — R1032 Left lower quadrant pain: Secondary | ICD-10-CM | POA: Insufficient documentation

## 2023-08-09 DIAGNOSIS — K429 Umbilical hernia without obstruction or gangrene: Secondary | ICD-10-CM | POA: Diagnosis not present

## 2023-08-09 DIAGNOSIS — N838 Other noninflammatory disorders of ovary, fallopian tube and broad ligament: Secondary | ICD-10-CM | POA: Diagnosis not present

## 2023-08-09 DIAGNOSIS — N83292 Other ovarian cyst, left side: Secondary | ICD-10-CM | POA: Diagnosis not present

## 2023-08-09 DIAGNOSIS — D270 Benign neoplasm of right ovary: Secondary | ICD-10-CM | POA: Diagnosis not present

## 2023-08-09 DIAGNOSIS — R109 Unspecified abdominal pain: Secondary | ICD-10-CM

## 2023-08-09 LAB — CBC WITH DIFFERENTIAL/PLATELET
Abs Immature Granulocytes: 0.05 K/uL (ref 0.00–0.07)
Basophils Absolute: 0 K/uL (ref 0.0–0.1)
Basophils Relative: 0 %
Eosinophils Absolute: 0 K/uL (ref 0.0–0.5)
Eosinophils Relative: 0 %
HCT: 46.1 % — ABNORMAL HIGH (ref 36.0–46.0)
Hemoglobin: 15.4 g/dL — ABNORMAL HIGH (ref 12.0–15.0)
Immature Granulocytes: 0 %
Lymphocytes Relative: 5 %
Lymphs Abs: 0.7 K/uL (ref 0.7–4.0)
MCH: 30 pg (ref 26.0–34.0)
MCHC: 33.4 g/dL (ref 30.0–36.0)
MCV: 89.9 fL (ref 80.0–100.0)
Monocytes Absolute: 0.5 K/uL (ref 0.1–1.0)
Monocytes Relative: 4 %
Neutro Abs: 11.6 K/uL — ABNORMAL HIGH (ref 1.7–7.7)
Neutrophils Relative %: 91 %
Platelets: 267 K/uL (ref 150–400)
RBC: 5.13 MIL/uL — ABNORMAL HIGH (ref 3.87–5.11)
RDW: 14 % (ref 11.5–15.5)
WBC: 12.8 K/uL — ABNORMAL HIGH (ref 4.0–10.5)
nRBC: 0 % (ref 0.0–0.2)

## 2023-08-09 LAB — URINALYSIS, ROUTINE W REFLEX MICROSCOPIC
Bilirubin Urine: NEGATIVE
Glucose, UA: NEGATIVE mg/dL
Ketones, ur: NEGATIVE mg/dL
Leukocytes,Ua: NEGATIVE
Nitrite: NEGATIVE
Protein, ur: NEGATIVE mg/dL
Specific Gravity, Urine: >1.046 — ABNORMAL HIGH (ref 1.005–1.030)
pH: 5 (ref 5.0–8.0)

## 2023-08-09 LAB — COMPREHENSIVE METABOLIC PANEL WITH GFR
ALT: 32 U/L (ref 0–44)
AST: 41 U/L (ref 15–41)
Albumin: 4.1 g/dL (ref 3.5–5.0)
Alkaline Phosphatase: 64 U/L (ref 38–126)
Anion gap: 15 (ref 5–15)
BUN: 10 mg/dL (ref 6–20)
CO2: 19 mmol/L — ABNORMAL LOW (ref 22–32)
Calcium: 9.8 mg/dL (ref 8.9–10.3)
Chloride: 106 mmol/L (ref 98–111)
Creatinine, Ser: 0.99 mg/dL (ref 0.44–1.00)
GFR, Estimated: >60 mL/min (ref >60)
Glucose, Bld: 108 mg/dL — ABNORMAL HIGH (ref 70–99)
Potassium: 3.7 mmol/L (ref 3.5–5.1)
Sodium: 140 mmol/L (ref 135–145)
Total Bilirubin: 0.9 mg/dL (ref 0.0–1.2)
Total Protein: 7.6 g/dL (ref 6.5–8.1)

## 2023-08-09 LAB — HCG, SERUM, QUALITATIVE: Preg, Serum: NEGATIVE

## 2023-08-09 LAB — LIPASE, BLOOD: Lipase: 48 U/L (ref 11–51)

## 2023-08-09 MED ORDER — SODIUM CHLORIDE 0.9 % IV BOLUS
1000.0000 mL | Freq: Once | INTRAVENOUS | Status: AC
  Administered 2023-08-09: 1000 mL via INTRAVENOUS

## 2023-08-09 MED ORDER — ONDANSETRON HCL 4 MG/2ML IJ SOLN
4.0000 mg | Freq: Once | INTRAMUSCULAR | Status: AC
  Administered 2023-08-09: 4 mg via INTRAMUSCULAR

## 2023-08-09 MED ORDER — ONDANSETRON HCL 4 MG/2ML IJ SOLN
4.0000 mg | Freq: Once | INTRAMUSCULAR | Status: AC
  Administered 2023-08-09: 4 mg via INTRAVENOUS
  Filled 2023-08-09: qty 2

## 2023-08-09 MED ORDER — ONDANSETRON HCL 4 MG/2ML IJ SOLN
INTRAMUSCULAR | Status: AC
  Filled 2023-08-09: qty 2

## 2023-08-09 MED ORDER — ALUM & MAG HYDROXIDE-SIMETH 200-200-20 MG/5ML PO SUSP: 30.0000 mL | Freq: Once | ORAL | Status: DC

## 2023-08-09 MED ORDER — LIDOCAINE VISCOUS HCL 2 % MT SOLN: 15.0000 mL | Freq: Once | OROMUCOSAL | Status: DC

## 2023-08-09 MED ORDER — FENTANYL CITRATE PF 50 MCG/ML IJ SOSY
50.0000 ug | PREFILLED_SYRINGE | Freq: Once | INTRAMUSCULAR | Status: AC
  Administered 2023-08-09: 50 ug via INTRAMUSCULAR
  Filled 2023-08-09: qty 1

## 2023-08-09 MED ORDER — IOHEXOL 350 MG/ML SOLN
75.0000 mL | Freq: Once | INTRAVENOUS | Status: AC | PRN
  Administered 2023-08-09: 75 mL via INTRAVENOUS

## 2023-08-09 MED ORDER — HYDROMORPHONE HCL 1 MG/ML IJ SOLN
0.5000 mg | Freq: Once | INTRAMUSCULAR | Status: AC
  Administered 2023-08-09: 0.5 mg via INTRAVENOUS
  Filled 2023-08-09: qty 1

## 2023-08-09 NOTE — ED Provider Notes (Signed)
 [place] EMERGENCY DEPARTMENT AT [facility] Provider Note   CSN: [id number] Arrival date & time: 08/09/23  1701     Patient presents with: Abdominal Pain   Leah Cruz is a 43 y.o. female.   HPI 43 year old female presents with abdominal pain.  Started around 3 AM after she went to a cookout.  She has had vomiting but no urinary symptoms or diarrhea.  The pain is primarily in her left lower abdomen as well as her left low back.  Pain has been progressively worsening, started off gradual.  Currently severe.  No reported fevers but she had some chills.  Prior to Admission medications   Medication Sig Start Date End Date Taking? Authorizing Provider  acyclovir  (ZOVIRAX ) 400 MG tablet Take 1 tablet (400 mg total) by mouth 2 (two) times daily. 02/19/23   Rudy Carlin LABOR, MD  ibuprofen  (ADVIL ) 800 MG tablet Take 1 tablet (800 mg total) by mouth every 8 (eight) hours as needed. 02/19/23   Rudy Carlin LABOR, MD  medroxyPROGESTERone  Acetate 150 MG/ML SUSY INJECT 1 ML INTRAMUSCULARLY ONCE EVERY 3 MONTHS 01/06/23   Constant, Peggy, MD  medroxyPROGESTERone  Acetate 150 MG/ML SUSY INJECT 1 ML INTRAMUSCULARLY ONCE EVERY 3 MONTHS 06/09/23   Fredirick Glenys RAMAN, MD  ondansetron  (ZOFRAN ) 4 MG tablet Take 1 tablet (4 mg total) by mouth every 6 (six) hours. 04/17/22   Prosperi, Christian H, PA-C    Allergies: Patient has no known allergies.    Review of Systems  Constitutional:  Negative for fever.  Gastrointestinal:  Positive for abdominal pain and vomiting. Negative for diarrhea.  Genitourinary:  Negative for dysuria and hematuria.    Updated Vital Signs BP 109/75   Pulse 64   Resp 18   LMP  (LMP Unknown)   SpO2 98%   Physical Exam Vitals and nursing note reviewed.  Constitutional:      Appearance: She is well-developed. She is not diaphoretic.  HENT:     Head: Normocephalic and atraumatic.  Cardiovascular:     Rate and Rhythm: Normal rate and regular rhythm.     Heart sounds:  Normal heart sounds.  Pulmonary:     Effort: Pulmonary effort is normal.     Breath sounds: Normal breath sounds.  Abdominal:     Palpations: Abdomen is soft.     Tenderness: There is abdominal tenderness in the left lower quadrant. There is no right CVA tenderness or left CVA tenderness.  Skin:    General: Skin is warm and dry.  Neurological:     Mental Status: She is alert.     (all labs ordered are listed, but only abnormal results are displayed) Labs Reviewed  CBC WITH DIFFERENTIAL/PLATELET - Abnormal; Notable for the following components:      Result Value   WBC 12.8 (*)    RBC 5.13 (*)    Hemoglobin 15.4 (*)    HCT 46.1 (*)    Neutro Abs 11.6 (*)    All other components within normal limits  COMPREHENSIVE METABOLIC PANEL WITH GFR - Abnormal; Notable for the following components:   CO2 19 (*)    Glucose, Bld 108 (*)    All other components within normal limits  URINALYSIS, ROUTINE W REFLEX MICROSCOPIC - Abnormal; Notable for the following components:   Specific Gravity, Urine >1.046 (*)    Hgb urine dipstick SMALL (*)    Bacteria, UA RARE (*)    All other components within normal limits  LIPASE, BLOOD  HCG, SERUM, QUALITATIVE    EKG: None  Radiology: US  PELVIC COMPLETE W TRANSVAGINAL AND TORSION R/O Result Date: 08/09/2023 CLINICAL DATA:  Left lower quadrant pain. EXAM: TRANSABDOMINAL AND TRANSVAGINAL ULTRASOUND OF PELVIS DOPPLER ULTRASOUND OF OVARIES TECHNIQUE: Both transabdominal and transvaginal ultrasound examinations of the pelvis were performed. Transabdominal technique was performed for global imaging of the pelvis including uterus, ovaries, adnexal regions, and pelvic cul-de-sac. It was necessary to proceed with endovaginal exam following the transabdominal exam to visualize the uterus, endometrium and ovaries to better advantage. Color and duplex Doppler ultrasound was utilized to evaluate blood flow to the ovaries. COMPARISON:  Current abdomen and pelvis CT  and prior studies. FINDINGS: Uterus Measurements: 9.4 x 3.6 x 5.0 cm = volume: 88 mL. No fibroids or other mass visualized. Endometrium Thickness: 5 mm.  No focal abnormality visualized. Right ovary Measurements: Size in 3 dimensions = volume: 3.3 x 2.7 x 2.7 cm 12.3 mL. Round homogeneous echogenic mass lies within the ovary measuring 2.5 x 2.0 x 2.1 cm, consistent with a dermoid and corresponding to the fat attenuation, stable mass seen on CT. No adnexal masses. Left ovary Measurements: 3.0 x 1.9 x 2.0 cm = volume: 5.8 mL. Simple appearing cyst, 1.7 x 1.5 0.7 cm. No adnexal masses. Pulsed Doppler evaluation of both ovaries demonstrates normal low-resistance arterial and venous waveforms. Other findings No abnormal free fluid. IMPRESSION: 1. No acute findings. No findings to account for the patient is left lower quadrant pain. 2. Simple appearing left ovarian cyst measuring 0.7 cm. No followup imaging recommended. Note: This recommendation does not apply to premenarchal patients or to those with increased risk (genetic, family history, elevated tumor markers or other high-risk factors) of ovarian cancer. Reference: Radiology 2019 Nov; 293(2):359-371. 3. Echogenic homogeneous rounded mass in the right ovary measuring 2.5 cm in greatest dimension, consistent with a benign dermoid. This appears stable on CT. Electronically Signed   By: Alm Parkins M.D.   On: 08/09/2023 20:32   CT ABDOMEN PELVIS W CONTRAST Result Date: 08/09/2023 CLINICAL DATA:  Left lower quadrant abdominal pain. EXAM: CT ABDOMEN AND PELVIS WITH CONTRAST TECHNIQUE: Multidetector CT imaging of the abdomen and pelvis was performed using the standard protocol following bolus administration of intravenous contrast. RADIATION DOSE REDUCTION: This exam was performed according to the departmental dose-optimization program which includes automated exposure control, adjustment of the mA and/or kV according to patient size and/or use of iterative  reconstruction technique. CONTRAST:  75mL OMNIPAQUE  IOHEXOL  350 MG/ML SOLN COMPARISON:  07/14/2020. FINDINGS: Lower chest: Clear lung bases.  Heart normal in size. Hepatobiliary: No focal liver abnormality is seen. No gallstones, gallbladder wall thickening, or biliary dilatation. Pancreas: Unremarkable. No pancreatic ductal dilatation or surrounding inflammatory changes. Spleen: Normal in size without focal abnormality. Adrenals/Urinary Tract: Adrenal glands are unremarkable. Kidneys are normal, without renal calculi, focal lesion, or hydronephrosis. Bladder is unremarkable. Stomach/Bowel: Stomach is within normal limits. Appendix appears normal. No evidence of bowel wall thickening, distention, or inflammatory changes. Vascular/Lymphatic: Minimal aortic atherosclerosis. No aneurysm. No enlarged lymph nodes. Reproductive: Uterus normal in size and configuration. There are prominent periuterine veins, particularly on the left. 1.8 cm right ovary fat containing mass consistent with a dermoid, without significant change. Adnexa otherwise unremarkable. Other: Small fat containing umbilical hernia, stable.  No ascites. Musculoskeletal: Normal. IMPRESSION: 1. No acute abnormality within the abdomen or pelvis. 2. Prominent periuterine veins, particularly on the left. Consider pelvic congestion syndrome in the proper clinical setting. 3. Stable right ovarian dermoid. Electronically  Signed   By: Alm Parkins M.D.   On: 08/09/2023 19:43     Procedures   Medications Ordered in the ED  fentaNYL  (SUBLIMAZE ) injection 50 mcg (50 mcg Intramuscular Given 08/09/23 1746)  ondansetron  (ZOFRAN ) injection 4 mg (4 mg Intravenous Given 08/09/23 1823)  sodium chloride  0.9 % bolus 1,000 mL (0 mLs Intravenous Stopped 08/09/23 2233)  HYDROmorphone  (DILAUDID ) injection 0.5 mg (0.5 mg Intravenous Given 08/09/23 1823)  iohexol  (OMNIPAQUE ) 350 MG/ML injection 75 mL (75 mLs Intravenous Contrast Given 08/09/23 1927)                                     Medical Decision Making Amount and/or Complexity of Data Reviewed Labs: ordered.    Details: Mild leukocytosis Radiology: ordered and independent interpretation performed.    Details: No diverticulitis  Risk Prescription drug management.   Patient is having significant abdominal pain, ultimately controlled with IV Dilaudid  and Zofran .  Afterwards, pain has not recurred.  CT with possible pelvic congestion syndrome.  Given her pain is now well-controlled and the rest of her workup is unremarkable including no ovarian torsion, I think she is stable for discharge.  No urinary symptoms and UA is benign.  No vaginal/STI symptoms.  Will refer to her OB/GYN.  Discharged home with return precautions.     Final diagnoses:  Left sided abdominal pain    ED Discharge Orders     None          Freddi Hamilton, MD 08/09/23 2317

## 2023-08-09 NOTE — ED Provider Notes (Addendum)
 [facility]    CSN: [id number] Arrival date & time: 08/09/23  1546      History   Chief Complaint Chief Complaint  Patient presents with   Abdominal Pain    HPI Leah Cruz is a 43 y.o. female.   Presenting with severe abdominal pain.  She states she had cookout at 3 AM this morning and has been vomiting since then. She is unable to answer further questions.  She is screaming and inconsolable as she is being wheeled back to room 7.  Will attempt to give IM Zofran  followed by a GI cocktail and if there is no improvement in symptoms we will recommend she is evaluated at the emergency department.  She will not sit back in the wheelchair to allow us  to obtain full vital signs.  On re examination after zofran  she is still having severe left lower quadrant pain and she is inconsolable.  Recommend further evaluation at the emergency room  The history is provided by the patient.  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 Authorizing Provider  acyclovir  (ZOVIRAX ) 400 MG tablet Take 1 tablet (400 mg total) by mouth 2 (two) times daily. 02/19/23   Rudy Carlin LABOR, MD  ibuprofen  (ADVIL ) 800 MG tablet Take 1 tablet (800 mg total) by mouth every 8 (eight) hours as needed. 02/19/23   Rudy Carlin LABOR, MD   medroxyPROGESTERone  Acetate 150 MG/ML SUSY INJECT 1 ML INTRAMUSCULARLY ONCE EVERY 3 MONTHS 01/06/23   Constant, Peggy, MD  medroxyPROGESTERone  Acetate 150 MG/ML SUSY INJECT 1 ML INTRAMUSCULARLY ONCE EVERY 3 MONTHS 06/09/23   Fredirick Glenys RAMAN, MD  ondansetron  (ZOFRAN ) 4 MG tablet Take 1 tablet (4 mg total) by mouth every 6 (six) hours. 04/17/22   Prosperi, Sherlean DEL, PA-C    Family History History reviewed. No pertinent family history.  Social History Social History   Tobacco Use   Smoking status: Former    Current packs/day: 0.50    Average packs/day: 0.5 packs/day for 15.0 years (7.5 ttl pk-yrs)    Types: Cigarettes   Smokeless tobacco: Never  Vaping Use   Vaping status: Never Used  Substance Use Topics   Alcohol use: Yes    Comment: occasional   Drug use: Yes    Frequency: 7.0 times per week    Types: Marijuana    Comment: daily     Allergies   Patient has no known allergies.   Review of Systems Review of  Systems  Gastrointestinal:  Positive for abdominal pain.     Physical Exam Triage Vital Signs ED Triage Vitals  Encounter Vitals Group     BP --      Girls Systolic BP Percentile --      Girls Diastolic BP Percentile --      Boys Systolic BP Percentile --      Boys Diastolic BP Percentile --      Pulse Rate 08/09/23 1607 80     Resp 08/09/23 1607 16     Temp 08/09/23 1607 98.6 F (37 C)     Temp Source 08/09/23 1607 Oral     SpO2 08/09/23 1607 98 %     Weight --      Height --      Head Circumference --      Peak Flow --      Pain Score 08/09/23 1608 10     Pain Loc --      Pain Education --      Exclude from Growth Chart --    No data found.  Updated Vital Signs Pulse 80   Temp 98.6 F (37 C) (Oral)   Resp 16   LMP  (LMP Unknown)   SpO2 98%   Visual Acuity Right Eye Distance:   Left Eye Distance:   Bilateral Distance:    Right Eye Near:   Left Eye Near:    Bilateral Near:     Physical Exam Constitutional:      General: She is in  acute distress.  Abdominal:     Tenderness: There is abdominal tenderness in the left lower quadrant.      UC Treatments / Results  Labs (all labs ordered are listed, but only abnormal results are displayed) Labs Reviewed - No data to display  EKG   Radiology No results found.  Procedures Procedures (including critical care time)  Medications Ordered in UC Medications  alum & mag hydroxide-simeth (MAALOX/MYLANTA) 200-200-20 MG/5ML suspension 30 mL (has no administration in time range)  lidocaine  (XYLOCAINE ) 2 % viscous mouth solution 15 mL (has no administration in time range)  ondansetron  (ZOFRAN ) injection 4 mg (4 mg Intramuscular Given 08/09/23 1621)    Initial Impression / Assessment and Plan / UC Course  I have reviewed the triage vital signs and the nursing notes.  Pertinent labs & imaging results that were available during my care of the patient were reviewed by me and considered in my medical decision making (see chart for details).   She had no relief of her nausea or abdominal pain with IM Zofran .  Recommend further evaluation in the emergency department as she is reporting left lower quadrant abdominal pain.   Final Clinical Impressions(s) / UC Diagnoses   Final diagnoses:  Generalized abdominal pain     Discharge Instructions      Unable to manage pain and complete work up at urgent care. Recommend transfer to the emergency department      ED Prescriptions   None    PDMP not reviewed this encounter.   Remi Pippin, NP 08/09/23 1633    Remi Pippin, NP 08/09/23 [phone]

## 2023-08-09 NOTE — ED Notes (Signed)
 Attempted for third time to call [facility] ED Charge RN to make aware of patient coming via [facility] but no answer.

## 2023-08-09 NOTE — ED Notes (Signed)
 Called [facility] ED charge but got voicemail. Left message that UC was trying to give report on patient coming via [facility] but gave no patient information on VM.

## 2023-08-09 NOTE — ED Notes (Signed)
 Patient is being discharged from the Urgent Care and sent to the Emergency Department via [facility] . Per provider, patient is in need of higher level of care due to abd pain and vomiting. Patient is aware and verbalizes understanding of plan of care.  Vitals:   08/09/23 1607 08/09/23 1640  BP:  (!) 149/100  Pulse: 80   Resp: 16   Temp: 98.6 F (37 C)   SpO2: 98%

## 2023-08-09 NOTE — ED Provider Triage Note (Signed)
 Emergency Medicine Provider Triage Evaluation Note  Leah Cruz , a 43 y.o. female  was evaluated in triage.  Pt complains of acute left lower quadrant abdominal pain that started earlier today.  Patient does not participate in exam and is persistently yelling and moving. Reportedly she has been vomiting. Review of Systems  Positive:  Negative:   Physical Exam  BP (!) 150/108 (BP Location: Left Arm)   Pulse 99   Resp 20   LMP  (LMP Unknown)   SpO2 100%  Gen:   Awake, appears uncomfortable Resp:  Normal effort  MSK:   Moves extremities without difficulty  Other:  Abdomen with left lower quadrant tenderness, positive left CVAT  Medical Decision Making  Medically screening exam initiated at 5:26 PM.  Appropriate orders placed.  Chanetta Moosman Ruhe was informed that the remainder of the evaluation will be completed by another provider, this initial triage assessment does not replace that evaluation, and the importance of remaining in the ED until their evaluation is complete.  Obtain CT abdomen pelvis, pelvic ultrasound and routine labs   Donnajean Lynwood DEL, DEVONNA 08/09/23 1728

## 2023-08-09 NOTE — Discharge Instructions (Signed)
 If you develop worsening, continued, or recurrent abdominal pain, uncontrolled vomiting, fever, chest or back pain, or any other new/concerning symptoms then return to the ER for evaluation.

## 2023-08-09 NOTE — ED Triage Notes (Signed)
 Pt BIB [facility] from UC with reports of LLQ abdominal pain and left lower back pain that started this morning.

## 2023-08-09 NOTE — ED Notes (Signed)
 Patient given water by this nurse.

## 2023-08-09 NOTE — ED Notes (Signed)
 Report given over phone to [facility]. [facility] ETA 5 minutes.

## 2023-08-09 NOTE — Discharge Instructions (Addendum)
 Unable to manage pain and complete work up at urgent care. Recommend transfer to the emergency department

## 2023-08-09 NOTE — ED Notes (Signed)
 [facility] made aware of patient needing transport to ED.

## 2023-08-09 NOTE — ED Notes (Signed)
 Called Charge RN phone again without answer. No voicemail left this time

## 2023-08-09 NOTE — ED Triage Notes (Addendum)
 Patient came in today with c/o abd pain and vomiting since 5 am this morning. Patient states that she ate cookout at 3 am this morning. Patient is crying and hollering non stop. Patient was also moving around and I was unable to get a blood pressure.

## 2023-09-01 ENCOUNTER — Other Ambulatory Visit: Payer: Self-pay

## 2023-09-01 ENCOUNTER — Other Ambulatory Visit: Payer: Self-pay | Admitting: Family Medicine

## 2023-09-01 DIAGNOSIS — Z3042 Encounter for surveillance of injectable contraceptive: Secondary | ICD-10-CM

## 2023-09-01 MED ORDER — MEDROXYPROGESTERONE ACETATE 150 MG/ML IM SUSP: 150.0000 mg | INTRAMUSCULAR | 2 refills | Status: AC

## 2023-09-02 ENCOUNTER — Ambulatory Visit

## 2023-09-02 DIAGNOSIS — Z3042 Encounter for surveillance of injectable contraceptive: Secondary | ICD-10-CM

## 2023-09-02 MED ORDER — MEDROXYPROGESTERONE ACETATE 150 MG/ML IM SUSP
150.0000 mg | Freq: Once | INTRAMUSCULAR | Status: AC
  Administered 2023-09-02 (×2): 150 mg via INTRAMUSCULAR

## 2023-09-02 NOTE — Progress Notes (Signed)
 Date last pap: 02/19/23. Last Depo-Provera : 06/17/23. Side Effects if any: none. Serum HCG indicated? N/a. Depo-Provera  150 mg IM given by: Missouri Lapaglia, rn. Next appointment due October 28- December 02, 2023.

## 2023-11-18 ENCOUNTER — Ambulatory Visit

## 2023-11-18 DIAGNOSIS — Z3042 Encounter for surveillance of injectable contraceptive: Secondary | ICD-10-CM

## 2023-11-18 MED ORDER — MEDROXYPROGESTERONE ACETATE 150 MG/ML IM SUSP
150.0000 mg | Freq: Once | INTRAMUSCULAR | Status: AC
  Administered 2023-11-18: 150 mg via INTRAMUSCULAR

## 2023-11-18 NOTE — Progress Notes (Addendum)
 Date last pap: 02/19/23. Last Depo-Provera : 09/02/23. Side Effects if any: NA. Serum HCG indicated? NA. Depo-Provera  150 mg IM given by: Duwaine Galla, RN in [facility]. Next appointment due Jan 13-27 2026.

## 2024-02-03 ENCOUNTER — Ambulatory Visit

## 2024-02-03 VITALS — BP 128/87 | HR 78 | Ht 67.0 in | Wt 156.0 lb

## 2024-02-03 DIAGNOSIS — Z3042 Encounter for surveillance of injectable contraceptive: Secondary | ICD-10-CM

## 2024-02-03 MED ORDER — MEDROXYPROGESTERONE ACETATE 150 MG/ML IM SUSY: PREFILLED_SYRINGE | Freq: Once | INTRAMUSCULAR | Status: AC

## 2024-02-03 NOTE — Progress Notes (Signed)
 Date last pap: 02/19/23. Last Depo-Provera : 11/18/23. Side Effects if any: none reported. Serum HCG indicated? no. Depo-Provera  150 mg IM given by: Orlene Deidra Bang, RN. Next appointment due 3/31-4/14.Due for Annual at same visit.

## 2024-04-20 ENCOUNTER — Ambulatory Visit: Payer: Self-pay
